# Patient Record
Sex: Female | Born: 1950 | Race: White | Hispanic: No | Smoking: Former smoker
Health system: Southern US, Community
[De-identification: ages and names within clinical notes are randomized; demographics above are authoritative.]

## PROBLEM LIST (undated history)

## (undated) DIAGNOSIS — K259 Gastric ulcer, unspecified as acute or chronic, without hemorrhage or perforation: Secondary | ICD-10-CM

## (undated) DIAGNOSIS — D219 Benign neoplasm of connective and other soft tissue, unspecified: Secondary | ICD-10-CM

## (undated) DIAGNOSIS — K219 Gastro-esophageal reflux disease without esophagitis: Secondary | ICD-10-CM

## (undated) DIAGNOSIS — J8410 Pulmonary fibrosis, unspecified: Secondary | ICD-10-CM

## (undated) HISTORY — PX: LUNG BIOPSY: SHX232

## (undated) HISTORY — PX: APPENDECTOMY: SHX54

## (undated) HISTORY — DX: Gastric ulcer, unspecified as acute or chronic, without hemorrhage or perforation: K25.9

## (undated) HISTORY — DX: Pulmonary fibrosis, unspecified: J84.10

## (undated) HISTORY — DX: Gastro-esophageal reflux disease without esophagitis: K21.9

---

## 2011-04-28 ENCOUNTER — Ambulatory Visit: Payer: Self-pay | Admitting: Family Medicine

## 2011-07-24 ENCOUNTER — Ambulatory Visit: Payer: Self-pay | Admitting: Family Medicine

## 2012-03-02 ENCOUNTER — Ambulatory Visit: Payer: Self-pay | Admitting: Internal Medicine

## 2012-03-20 ENCOUNTER — Inpatient Hospital Stay: Payer: Self-pay | Admitting: Internal Medicine

## 2012-03-20 LAB — COMPREHENSIVE METABOLIC PANEL
Anion Gap: 8 (ref 7–16)
BUN: 21 mg/dL — ABNORMAL HIGH (ref 7–18)
Bilirubin,Total: 0.5 mg/dL (ref 0.2–1.0)
Calcium, Total: 8.9 mg/dL (ref 8.5–10.1)
Chloride: 105 mmol/L (ref 98–107)
EGFR (African American): >60
EGFR (Non-African Amer.): >60
Total Protein: 6.9 g/dL (ref 6.4–8.2)

## 2012-03-20 LAB — CBC
HGB: 10.6 g/dL — ABNORMAL LOW (ref 12.0–16.0)
MCV: 104 fL — ABNORMAL HIGH (ref 80–100)
Platelet: 266 10*3/uL (ref 150–440)
RBC: 3.29 10*6/uL — ABNORMAL LOW (ref 3.80–5.20)
WBC: 14.2 10*3/uL — ABNORMAL HIGH (ref 3.6–11.0)

## 2012-03-20 LAB — CK TOTAL AND CKMB (NOT AT ARMC)
CK, Total: 106 U/L (ref 21–215)
CK-MB: 5.9 ng/mL — ABNORMAL HIGH (ref 0.5–3.6)

## 2012-03-20 LAB — PRO B NATRIURETIC PEPTIDE: B-Type Natriuretic Peptide: 119 pg/mL (ref 0–125)

## 2012-03-20 LAB — TROPONIN I: Troponin-I: <0.02 ng/mL

## 2012-03-21 LAB — CBC WITH DIFFERENTIAL/PLATELET
Basophil #: 0 10*3/uL (ref 0.0–0.1)
Lymphocyte %: 6.1 %
MCV: 104 fL — ABNORMAL HIGH (ref 80–100)
Monocyte #: 0.7 x10 3/mm (ref 0.2–0.9)
Monocyte %: 5.7 %
Neutrophil #: 10.6 10*3/uL — ABNORMAL HIGH (ref 1.4–6.5)
Neutrophil %: 88.2 %
Platelet: 186 10*3/uL (ref 150–440)
WBC: 12 10*3/uL — ABNORMAL HIGH (ref 3.6–11.0)

## 2012-03-21 LAB — COMPREHENSIVE METABOLIC PANEL
Albumin: 2.7 g/dL — ABNORMAL LOW (ref 3.4–5.0)
Alkaline Phosphatase: 55 U/L (ref 50–136)
Anion Gap: 6 — ABNORMAL LOW (ref 7–16)
BUN: 17 mg/dL (ref 7–18)
Calcium, Total: 8.4 mg/dL — ABNORMAL LOW (ref 8.5–10.1)
Creatinine: 0.85 mg/dL (ref 0.60–1.30)
EGFR (African American): >60
EGFR (Non-African Amer.): >60
Osmolality: 286 (ref 275–301)
Potassium: 4.4 mmol/L (ref 3.5–5.1)
SGOT(AST): 27 U/L (ref 15–37)
SGPT (ALT): 25 U/L

## 2012-03-22 LAB — CREATININE, SERUM: Creatinine: 0.99 mg/dL (ref 0.60–1.30)

## 2012-03-22 LAB — VANCOMYCIN, TROUGH: Vancomycin, Trough: 6 ug/mL — ABNORMAL LOW (ref 10–20)

## 2012-03-23 LAB — CREATININE, SERUM: EGFR (African American): >60

## 2012-03-24 LAB — VANCOMYCIN, TROUGH: Vancomycin, Trough: 11 ug/mL (ref 10–20)

## 2012-03-26 LAB — BASIC METABOLIC PANEL
Anion Gap: 10 (ref 7–16)
BUN: 32 mg/dL — ABNORMAL HIGH (ref 7–18)
Co2: 29 mmol/L (ref 21–32)
EGFR (African American): 55 — ABNORMAL LOW
EGFR (Non-African Amer.): 47 — ABNORMAL LOW
Osmolality: 281 (ref 275–301)
Potassium: 4.8 mmol/L (ref 3.5–5.1)
Sodium: 136 mmol/L (ref 136–145)

## 2012-03-26 LAB — HEMOGLOBIN
HGB: 8.7 g/dL — ABNORMAL LOW (ref 12.0–16.0)
HGB: 8.9 g/dL — ABNORMAL LOW (ref 12.0–16.0)

## 2012-03-26 LAB — CULTURE, BLOOD (SINGLE)

## 2012-04-01 ENCOUNTER — Ambulatory Visit: Payer: Self-pay | Admitting: Internal Medicine

## 2013-08-25 ENCOUNTER — Emergency Department: Payer: Self-pay | Admitting: Emergency Medicine

## 2013-08-26 ENCOUNTER — Other Ambulatory Visit: Payer: Self-pay

## 2013-08-26 ENCOUNTER — Encounter (HOSPITAL_COMMUNITY): Payer: Self-pay | Admitting: Emergency Medicine

## 2013-08-26 ENCOUNTER — Encounter (HOSPITAL_COMMUNITY): Payer: MEDICAID | Admitting: Anesthesiology

## 2013-08-26 ENCOUNTER — Inpatient Hospital Stay (HOSPITAL_COMMUNITY): Payer: Self-pay | Admitting: Anesthesiology

## 2013-08-26 ENCOUNTER — Inpatient Hospital Stay (HOSPITAL_COMMUNITY): Payer: Self-pay

## 2013-08-26 ENCOUNTER — Emergency Department (HOSPITAL_COMMUNITY): Payer: Self-pay

## 2013-08-26 ENCOUNTER — Encounter (HOSPITAL_COMMUNITY)
Admission: EM | Disposition: A | Discharge status: Hospice | Payer: Self-pay | Source: Ambulatory Visit | Attending: Pulmonary Disease

## 2013-08-26 ENCOUNTER — Inpatient Hospital Stay (HOSPITAL_COMMUNITY)
Admission: EM | Admit: 2013-08-26 | Discharge: 2013-09-08 | DRG: 326 | Disposition: A | Discharge status: Hospice | Payer: Self-pay | Source: Ambulatory Visit | Attending: Pulmonary Disease | Admitting: Pulmonary Disease

## 2013-08-26 DIAGNOSIS — R198 Other specified symptoms and signs involving the digestive system and abdomen: Secondary | ICD-10-CM

## 2013-08-26 DIAGNOSIS — E875 Hyperkalemia: Secondary | ICD-10-CM | POA: Diagnosis present

## 2013-08-26 DIAGNOSIS — E2740 Unspecified adrenocortical insufficiency: Secondary | ICD-10-CM

## 2013-08-26 DIAGNOSIS — IMO0002 Reserved for concepts with insufficient information to code with codable children: Secondary | ICD-10-CM

## 2013-08-26 DIAGNOSIS — Z515 Encounter for palliative care: Secondary | ICD-10-CM

## 2013-08-26 DIAGNOSIS — E2749 Other adrenocortical insufficiency: Secondary | ICD-10-CM | POA: Diagnosis present

## 2013-08-26 DIAGNOSIS — R1310 Dysphagia, unspecified: Secondary | ICD-10-CM | POA: Diagnosis present

## 2013-08-26 DIAGNOSIS — J4489 Other specified chronic obstructive pulmonary disease: Secondary | ICD-10-CM | POA: Diagnosis present

## 2013-08-26 DIAGNOSIS — K560 Paralytic ileus: Secondary | ICD-10-CM | POA: Diagnosis not present

## 2013-08-26 DIAGNOSIS — D696 Thrombocytopenia, unspecified: Secondary | ICD-10-CM | POA: Diagnosis present

## 2013-08-26 DIAGNOSIS — J9620 Acute and chronic respiratory failure, unspecified whether with hypoxia or hypercapnia: Secondary | ICD-10-CM | POA: Diagnosis present

## 2013-08-26 DIAGNOSIS — Y838 Other surgical procedures as the cause of abnormal reaction of the patient, or of later complication, without mention of misadventure at the time of the procedure: Secondary | ICD-10-CM | POA: Diagnosis not present

## 2013-08-26 DIAGNOSIS — E872 Acidosis, unspecified: Secondary | ICD-10-CM | POA: Diagnosis not present

## 2013-08-26 DIAGNOSIS — Z881 Allergy status to other antibiotic agents status: Secondary | ICD-10-CM

## 2013-08-26 DIAGNOSIS — E669 Obesity, unspecified: Secondary | ICD-10-CM | POA: Diagnosis present

## 2013-08-26 DIAGNOSIS — E878 Other disorders of electrolyte and fluid balance, not elsewhere classified: Secondary | ICD-10-CM | POA: Diagnosis present

## 2013-08-26 DIAGNOSIS — E876 Hypokalemia: Secondary | ICD-10-CM | POA: Diagnosis not present

## 2013-08-26 DIAGNOSIS — D649 Anemia, unspecified: Secondary | ICD-10-CM | POA: Diagnosis present

## 2013-08-26 DIAGNOSIS — R0902 Hypoxemia: Secondary | ICD-10-CM

## 2013-08-26 DIAGNOSIS — E871 Hypo-osmolality and hyponatremia: Secondary | ICD-10-CM | POA: Diagnosis present

## 2013-08-26 DIAGNOSIS — D72829 Elevated white blood cell count, unspecified: Secondary | ICD-10-CM | POA: Diagnosis present

## 2013-08-26 DIAGNOSIS — Z9981 Dependence on supplemental oxygen: Secondary | ICD-10-CM

## 2013-08-26 DIAGNOSIS — I498 Other specified cardiac arrhythmias: Secondary | ICD-10-CM | POA: Diagnosis present

## 2013-08-26 DIAGNOSIS — J449 Chronic obstructive pulmonary disease, unspecified: Secondary | ICD-10-CM | POA: Diagnosis present

## 2013-08-26 DIAGNOSIS — Z79899 Other long term (current) drug therapy: Secondary | ICD-10-CM

## 2013-08-26 DIAGNOSIS — K659 Peritonitis, unspecified: Secondary | ICD-10-CM | POA: Diagnosis present

## 2013-08-26 DIAGNOSIS — Z66 Do not resuscitate: Secondary | ICD-10-CM | POA: Diagnosis present

## 2013-08-26 DIAGNOSIS — E8779 Other fluid overload: Secondary | ICD-10-CM | POA: Diagnosis present

## 2013-08-26 DIAGNOSIS — D219 Benign neoplasm of connective and other soft tissue, unspecified: Secondary | ICD-10-CM

## 2013-08-26 DIAGNOSIS — Z01811 Encounter for preprocedural respiratory examination: Secondary | ICD-10-CM

## 2013-08-26 DIAGNOSIS — K929 Disease of digestive system, unspecified: Secondary | ICD-10-CM | POA: Diagnosis not present

## 2013-08-26 DIAGNOSIS — K255 Chronic or unspecified gastric ulcer with perforation: Principal | ICD-10-CM | POA: Diagnosis present

## 2013-08-26 DIAGNOSIS — R7989 Other specified abnormal findings of blood chemistry: Secondary | ICD-10-CM | POA: Diagnosis present

## 2013-08-26 DIAGNOSIS — J8410 Pulmonary fibrosis, unspecified: Secondary | ICD-10-CM | POA: Diagnosis present

## 2013-08-26 DIAGNOSIS — K251 Acute gastric ulcer with perforation: Secondary | ICD-10-CM

## 2013-08-26 HISTORY — DX: Benign neoplasm of connective and other soft tissue, unspecified: D21.9

## 2013-08-26 HISTORY — PX: LAPAROTOMY: SHX154

## 2013-08-26 LAB — COMPREHENSIVE METABOLIC PANEL
ALT: 24 U/L (ref 0–35)
Albumin: 3.5 g/dL (ref 3.4–5.0)
Alkaline Phosphatase: 71 U/L (ref 39–117)
BUN: 20 mg/dL — ABNORMAL HIGH (ref 7–18)
BUN: 23 mg/dL (ref 6–23)
Calcium: 8.8 mg/dL (ref 8.4–10.5)
Chloride: 100 mmol/L (ref 98–107)
Chloride: 94 mEq/L — ABNORMAL LOW (ref 96–112)
Co2: 27 mmol/L (ref 21–32)
Creatinine, Ser: 1.01 mg/dL (ref 0.50–1.10)
Creatinine: 0.81 mg/dL (ref 0.60–1.30)
GFR calc Af Amer: 68 mL/min — ABNORMAL LOW (ref >90)
GFR calc non Af Amer: 58 mL/min — ABNORMAL LOW (ref >90)
Glucose, Bld: 103 mg/dL — ABNORMAL HIGH (ref 70–99)
Glucose: 152 mg/dL — ABNORMAL HIGH (ref 65–99)
Potassium: 4.1 mmol/L (ref 3.5–5.1)
SGPT (ALT): 31 U/L (ref 12–78)
Sodium: 129 mEq/L — ABNORMAL LOW (ref 135–145)
Total Bilirubin: 1.2 mg/dL (ref 0.3–1.2)
Total Protein: 5.7 g/dL — ABNORMAL LOW (ref 6.0–8.3)
Total Protein: 5.8 g/dL — ABNORMAL LOW (ref 6.4–8.2)

## 2013-08-26 LAB — CBC WITH DIFFERENTIAL/PLATELET
Basophils Relative: 0 % (ref 0–1)
Eosinophil %: 0 %
Eosinophils Absolute: 0 10*3/uL (ref 0.0–0.7)
Eosinophils Relative: 0 % (ref 0–5)
HCT: 34.7 % — ABNORMAL LOW (ref 35.0–47.0)
HCT: 35.4 % — ABNORMAL LOW (ref 36.0–46.0)
Hemoglobin: 10.8 g/dL — ABNORMAL LOW (ref 12.0–15.0)
Lymphocyte #: 0.5 10*3/uL — ABNORMAL LOW (ref 1.0–3.6)
Lymphocyte %: 2.5 %
Lymphocytes Relative: 3 % — ABNORMAL LOW (ref 12–46)
Lymphs Abs: 0.6 10*3/uL — ABNORMAL LOW (ref 0.7–4.0)
MCH: 30.4 pg (ref 26.0–34.0)
MCH: 30.4 pg (ref 26.0–34.0)
MCHC: 30.5 g/dL (ref 30.0–36.0)
MCHC: 31.4 g/dL — ABNORMAL LOW (ref 32.0–36.0)
Monocyte %: 2 %
Monocytes Absolute: 0.2 10*3/uL (ref 0.1–1.0)
Neutro Abs: 18 10*3/uL — ABNORMAL HIGH (ref 1.7–7.7)
Neutrophil %: 95.3 %
Platelets: 178 10*3/uL (ref 150–400)
RBC: 3.59 10*6/uL — ABNORMAL LOW (ref 3.80–5.20)
RDW: 21.6 % — ABNORMAL HIGH (ref 11.5–14.5)
WBC: 21.4 10*3/uL — ABNORMAL HIGH (ref 3.6–11.0)

## 2013-08-26 LAB — BLOOD GAS, ARTERIAL
Acid-base deficit: 0.1 mmol/L (ref 0.0–2.0)
Bicarbonate: 25.6 mEq/L — ABNORMAL HIGH (ref 20.0–24.0)
FIO2: 1 %
O2 Saturation: 97.9 %
Patient temperature: 98.6
RATE: 14 resp/min
pCO2 arterial: 50.7 mmHg — ABNORMAL HIGH (ref 35.0–45.0)
pO2, Arterial: 452 mmHg — ABNORMAL HIGH (ref 80.0–100.0)

## 2013-08-26 LAB — TYPE AND SCREEN: Antibody Screen: NEGATIVE

## 2013-08-26 LAB — LIPASE, BLOOD
Lipase: 113 U/L (ref 73–393)
Lipase: 46 U/L (ref 11–59)

## 2013-08-26 LAB — GLUCOSE, CAPILLARY: Glucose-Capillary: 91 mg/dL (ref 70–99)

## 2013-08-26 LAB — ABO/RH: ABO/RH(D): A POS

## 2013-08-26 SURGERY — LAPAROTOMY, EXPLORATORY
Anesthesia: General | Site: Abdomen | Wound class: Dirty or Infected

## 2013-08-26 MED ORDER — MIDAZOLAM HCL 2 MG/2ML IJ SOLN
INTRAMUSCULAR | Status: AC
  Filled 2013-08-26: qty 2

## 2013-08-26 MED ORDER — PANTOPRAZOLE SODIUM 40 MG IV SOLR
40.0000 mg | Freq: Two times a day (BID) | INTRAVENOUS | Status: DC
  Administered 2013-08-30 – 2013-09-03 (×10): 40 mg via INTRAVENOUS
  Filled 2013-08-26 (×12): qty 40

## 2013-08-26 MED ORDER — ENOXAPARIN SODIUM 40 MG/0.4ML ~~LOC~~ SOLN
40.0000 mg | SUBCUTANEOUS | Status: DC
  Administered 2013-08-27 – 2013-09-08 (×12): 40 mg via SUBCUTANEOUS
  Filled 2013-08-26 (×14): qty 0.4

## 2013-08-26 MED ORDER — SUCCINYLCHOLINE CHLORIDE 20 MG/ML IJ SOLN
INTRAMUSCULAR | Status: AC
  Filled 2013-08-26: qty 1

## 2013-08-26 MED ORDER — POTASSIUM CHLORIDE IN NACL 20-0.9 MEQ/L-% IV SOLN
INTRAVENOUS | Status: DC
  Administered 2013-08-26 (×2): via INTRAVENOUS
  Filled 2013-08-26 (×4): qty 1000

## 2013-08-26 MED ORDER — ALBUTEROL SULFATE (5 MG/ML) 0.5% IN NEBU: 2.5000 mg | INHALATION_SOLUTION | RESPIRATORY_TRACT | Status: DC | PRN

## 2013-08-26 MED ORDER — BIOTENE DRY MOUTH MT LIQD
15.0000 mL | Freq: Four times a day (QID) | OROMUCOSAL | Status: DC
  Administered 2013-08-26 – 2013-08-28 (×8): 15 mL via OROMUCOSAL

## 2013-08-26 MED ORDER — MIDAZOLAM HCL 2 MG/2ML IJ SOLN
1.0000 mg | INTRAMUSCULAR | Status: DC | PRN
  Administered 2013-08-27 (×2): 2 mg via INTRAVENOUS
  Filled 2013-08-26 (×2): qty 2

## 2013-08-26 MED ORDER — SODIUM CHLORIDE 0.9 % IV SOLN
80.0000 mg | Freq: Once | INTRAVENOUS | Status: AC
  Administered 2013-08-26: 19:00:00 80 mg via INTRAVENOUS
  Filled 2013-08-26: qty 80

## 2013-08-26 MED ORDER — ONDANSETRON HCL 4 MG/2ML IJ SOLN: 4.0000 mg | Freq: Four times a day (QID) | INTRAMUSCULAR | Status: DC | PRN

## 2013-08-26 MED ORDER — SODIUM CHLORIDE 0.9 % IR SOLN
Status: DC | PRN
  Administered 2013-08-26: 5000 mL

## 2013-08-26 MED ORDER — FENTANYL CITRATE 0.05 MG/ML IJ SOLN
INTRAMUSCULAR | Status: AC
  Filled 2013-08-26: qty 5

## 2013-08-26 MED ORDER — FENTANYL CITRATE 0.05 MG/ML IJ SOLN
INTRAMUSCULAR | Status: AC
  Administered 2013-08-26: 100 ug
  Filled 2013-08-26: qty 2

## 2013-08-26 MED ORDER — CHLORHEXIDINE GLUCONATE 0.12 % MT SOLN
15.0000 mL | Freq: Two times a day (BID) | OROMUCOSAL | Status: DC
  Administered 2013-08-26 – 2013-08-29 (×6): 15 mL via OROMUCOSAL
  Filled 2013-08-26 (×7): qty 15

## 2013-08-26 MED ORDER — ALBUTEROL SULFATE (5 MG/ML) 0.5% IN NEBU: 2.5000 mg | INHALATION_SOLUTION | Freq: Four times a day (QID) | RESPIRATORY_TRACT | Status: DC

## 2013-08-26 MED ORDER — PIPERACILLIN-TAZOBACTAM 3.375 G IVPB
3.3750 g | Freq: Three times a day (TID) | INTRAVENOUS | Status: DC
  Administered 2013-08-26 – 2013-08-29 (×9): 3.375 g via INTRAVENOUS
  Filled 2013-08-26 (×10): qty 50

## 2013-08-26 MED ORDER — SODIUM CHLORIDE 0.9 % IV SOLN
INTRAVENOUS | Status: DC
  Administered 2013-08-26: 06:00:00 via INTRAVENOUS

## 2013-08-26 MED ORDER — PIPERACILLIN-TAZOBACTAM 3.375 G IVPB
INTRAVENOUS | Status: AC
  Filled 2013-08-26: qty 50

## 2013-08-26 MED ORDER — ONDANSETRON HCL 4 MG/2ML IJ SOLN
INTRAMUSCULAR | Status: DC | PRN
  Administered 2013-08-26: 4 mg via INTRAVENOUS

## 2013-08-26 MED ORDER — LIDOCAINE HCL (CARDIAC) 20 MG/ML IV SOLN
INTRAVENOUS | Status: DC | PRN
  Administered 2013-08-26: 60 mg via INTRAVENOUS

## 2013-08-26 MED ORDER — LACTATED RINGERS IV SOLN
INTRAVENOUS | Status: DC | PRN
  Administered 2013-08-26: 08:00:00 via INTRAVENOUS

## 2013-08-26 MED ORDER — PIPERACILLIN-TAZOBACTAM 3.375 G IVPB 30 MIN
3.3750 g | Freq: Once | INTRAVENOUS | Status: AC
  Administered 2013-08-26: 3.375 g via INTRAVENOUS

## 2013-08-26 MED ORDER — LACTATED RINGERS IV SOLN
INTRAVENOUS | Status: DC
  Administered 2013-08-26: 1000 mL via INTRAVENOUS

## 2013-08-26 MED ORDER — SODIUM CHLORIDE 0.9 % IV SOLN
0.0000 ug/h | INTRAVENOUS | Status: DC
  Administered 2013-08-26: 125 ug/h via INTRAVENOUS
  Administered 2013-08-27 (×2): 50 ug/h via INTRAVENOUS
  Administered 2013-08-28: 60 ug/h via INTRAVENOUS
  Administered 2013-08-30: 75 ug/h via INTRAVENOUS
  Filled 2013-08-26 (×5): qty 50

## 2013-08-26 MED ORDER — SODIUM CHLORIDE 0.9 % IV SOLN: 250.0000 mL | INTRAVENOUS | Status: DC | PRN

## 2013-08-26 MED ORDER — ROCURONIUM BROMIDE 100 MG/10ML IV SOLN
INTRAVENOUS | Status: DC | PRN
  Administered 2013-08-26: 50 mg via INTRAVENOUS
  Administered 2013-08-26: 30 mg via INTRAVENOUS

## 2013-08-26 MED ORDER — HYDROCORTISONE SOD SUCCINATE 100 MG IJ SOLR
50.0000 mg | Freq: Four times a day (QID) | INTRAMUSCULAR | Status: DC
  Administered 2013-08-26 – 2013-08-28 (×9): 50 mg via INTRAVENOUS
  Filled 2013-08-26: qty 1
  Filled 2013-08-26 (×5): qty 2
  Filled 2013-08-26: qty 1
  Filled 2013-08-26: qty 2
  Filled 2013-08-26 (×6): qty 1

## 2013-08-26 MED ORDER — ROCURONIUM BROMIDE 100 MG/10ML IV SOLN
INTRAVENOUS | Status: AC
  Filled 2013-08-26: qty 1

## 2013-08-26 MED ORDER — FENTANYL CITRATE 0.05 MG/ML IJ SOLN: 100.0000 ug | Freq: Once | INTRAMUSCULAR | Status: AC

## 2013-08-26 MED ORDER — PROPOFOL 10 MG/ML IV BOLUS
INTRAVENOUS | Status: AC
  Filled 2013-08-26: qty 20

## 2013-08-26 MED ORDER — ONDANSETRON HCL 4 MG PO TABS: 4.0000 mg | ORAL_TABLET | Freq: Four times a day (QID) | ORAL | Status: DC | PRN

## 2013-08-26 MED ORDER — LIDOCAINE HCL (CARDIAC) 20 MG/ML IV SOLN
INTRAVENOUS | Status: AC
  Filled 2013-08-26: qty 5

## 2013-08-26 MED ORDER — SUCCINYLCHOLINE CHLORIDE 20 MG/ML IJ SOLN
INTRAMUSCULAR | Status: DC | PRN
  Administered 2013-08-26: 100 mg via INTRAVENOUS

## 2013-08-26 MED ORDER — FLUCONAZOLE IN SODIUM CHLORIDE 400-0.9 MG/200ML-% IV SOLN
400.0000 mg | Freq: Every day | INTRAVENOUS | Status: DC
  Administered 2013-08-26 – 2013-09-01 (×7): 400 mg via INTRAVENOUS
  Filled 2013-08-26 (×8): qty 200

## 2013-08-26 MED ORDER — PHENYLEPHRINE HCL 10 MG/ML IJ SOLN
20.0000 mg | INTRAVENOUS | Status: DC | PRN
  Administered 2013-08-26: 20 ug/min via INTRAVENOUS

## 2013-08-26 MED ORDER — SODIUM CHLORIDE 0.9 % IV SOLN
8.0000 mg/h | INTRAVENOUS | Status: DC
  Administered 2013-08-26 – 2013-08-28 (×4): 8 mg/h via INTRAVENOUS
  Filled 2013-08-26 (×8): qty 80

## 2013-08-26 MED ORDER — ONDANSETRON HCL 4 MG/2ML IJ SOLN
INTRAMUSCULAR | Status: AC
  Filled 2013-08-26: qty 2

## 2013-08-26 MED ORDER — PHENYLEPHRINE 40 MCG/ML (10ML) SYRINGE FOR IV PUSH (FOR BLOOD PRESSURE SUPPORT)
PREFILLED_SYRINGE | INTRAVENOUS | Status: AC
  Filled 2013-08-26: qty 10

## 2013-08-26 MED ORDER — PHENYLEPHRINE HCL 10 MG/ML IJ SOLN
INTRAMUSCULAR | Status: AC
  Filled 2013-08-26: qty 2

## 2013-08-26 MED ORDER — FENTANYL BOLUS VIA INFUSION
50.0000 ug | INTRAVENOUS | Status: DC | PRN
  Administered 2013-08-29: 100 ug via INTRAVENOUS
  Administered 2013-08-29 (×2): 50 ug via INTRAVENOUS
  Filled 2013-08-26: qty 100

## 2013-08-26 MED ORDER — PROPOFOL 10 MG/ML IV BOLUS
INTRAVENOUS | Status: DC | PRN
  Administered 2013-08-26: 100 mg via INTRAVENOUS

## 2013-08-26 MED ORDER — HYDROCORTISONE SOD SUCCINATE 100 MG IJ SOLR
INTRAMUSCULAR | Status: AC
  Filled 2013-08-26: qty 2

## 2013-08-26 MED ORDER — INSULIN ASPART 100 UNIT/ML ~~LOC~~ SOLN
0.0000 [IU] | SUBCUTANEOUS | Status: DC
  Administered 2013-08-26 – 2013-09-03 (×4): 2 [IU] via SUBCUTANEOUS

## 2013-08-26 MED ORDER — PANTOPRAZOLE SODIUM 40 MG IV SOLR
40.0000 mg | Freq: Two times a day (BID) | INTRAVENOUS | Status: DC
  Administered 2013-08-26: 40 mg via INTRAVENOUS
  Filled 2013-08-26: qty 40

## 2013-08-26 MED ORDER — MIDAZOLAM HCL 5 MG/5ML IJ SOLN
INTRAMUSCULAR | Status: DC | PRN
  Administered 2013-08-26: 2 mg via INTRAVENOUS

## 2013-08-26 MED ORDER — FENTANYL CITRATE 0.05 MG/ML IJ SOLN
50.0000 ug | Freq: Once | INTRAMUSCULAR | Status: AC
Start: 2013-08-26 — End: 2013-08-26

## 2013-08-26 MED ORDER — FENTANYL CITRATE 0.05 MG/ML IJ SOLN
INTRAMUSCULAR | Status: DC | PRN
  Administered 2013-08-26 (×3): 50 ug via INTRAVENOUS

## 2013-08-26 MED ORDER — HYDROCORTISONE SOD SUCCINATE 100 MG IJ SOLR
INTRAMUSCULAR | Status: DC | PRN
  Administered 2013-08-26: 100 mg via INTRAVENOUS

## 2013-08-26 MED ORDER — ALBUTEROL SULFATE (5 MG/ML) 0.5% IN NEBU
2.5000 mg | INHALATION_SOLUTION | Freq: Four times a day (QID) | RESPIRATORY_TRACT | Status: DC
  Administered 2013-08-26 – 2013-09-02 (×28): 2.5 mg via RESPIRATORY_TRACT
  Filled 2013-08-26 (×28): qty 0.5

## 2013-08-26 MED ORDER — PHENYLEPHRINE HCL 10 MG/ML IJ SOLN
INTRAMUSCULAR | Status: DC | PRN
  Administered 2013-08-26 (×3): 80 ug via INTRAVENOUS

## 2013-08-26 SURGICAL SUPPLY — 37 items
APPLICATOR COTTON TIP 6IN STRL (MISCELLANEOUS) ×2 IMPLANT
BLADE EXTENDED COATED 6.5IN (ELECTRODE) IMPLANT
BLADE HEX COATED 2.75 (ELECTRODE) ×2 IMPLANT
CANISTER SUCTION 2500CC (MISCELLANEOUS) ×2 IMPLANT
COVER MAYO STAND STRL (DRAPES) IMPLANT
DRAIN CHANNEL 19F RND (DRAIN) ×6 IMPLANT
DRAPE LAPAROSCOPIC ABDOMINAL (DRAPES) ×2 IMPLANT
DRAPE WARM FLUID 44X44 (DRAPE) IMPLANT
ELECT REM PT RETURN 9FT ADLT (ELECTROSURGICAL) ×2
ELECTRODE REM PT RTRN 9FT ADLT (ELECTROSURGICAL) ×1 IMPLANT
GLOVE BIOGEL PI IND STRL 7.0 (GLOVE) ×1 IMPLANT
GLOVE BIOGEL PI INDICATOR 7.0 (GLOVE) ×1
GLOVE EUDERMIC 7 POWDERFREE (GLOVE) ×2 IMPLANT
GOWN PREVENTION PLUS LG XLONG (DISPOSABLE) ×2 IMPLANT
GOWN STRL REIN XL XLG (GOWN DISPOSABLE) ×4 IMPLANT
KIT BASIN OR (CUSTOM PROCEDURE TRAY) ×2 IMPLANT
NS IRRIG 1000ML POUR BTL (IV SOLUTION) ×2 IMPLANT
PACK GENERAL/GYN (CUSTOM PROCEDURE TRAY) ×2 IMPLANT
SPONGE GAUZE 4X4 12PLY (GAUZE/BANDAGES/DRESSINGS) ×2 IMPLANT
SPONGE LAP 18X18 X RAY DECT (DISPOSABLE) IMPLANT
STAPLER VISISTAT 35W (STAPLE) ×2 IMPLANT
SUCTION POOLE TIP (SUCTIONS) IMPLANT
SUT PDS AB 1 CTX 36 (SUTURE) ×4 IMPLANT
SUT PDS AB 1 TP1 96 (SUTURE) ×4 IMPLANT
SUT POLYDEK 5 CE 75 36 (SUTURE) ×2 IMPLANT
SUT RET BRIDGE (SUTURE) ×2 IMPLANT
SUT SILK 2 0 (SUTURE) ×1
SUT SILK 2 0 SH CR/8 (SUTURE) IMPLANT
SUT SILK 2-0 18XBRD TIE 12 (SUTURE) ×1 IMPLANT
SUT SILK 3 0 (SUTURE)
SUT SILK 3 0 SH CR/8 (SUTURE) ×2 IMPLANT
SUT SILK 3-0 18XBRD TIE 12 (SUTURE) IMPLANT
SUT VICRYL 2 0 18  UND BR (SUTURE)
SUT VICRYL 2 0 18 UND BR (SUTURE) IMPLANT
TOWEL OR 17X26 10 PK STRL BLUE (TOWEL DISPOSABLE) ×4 IMPLANT
TRAY FOLEY CATH 14FRSI W/METER (CATHETERS) ×2 IMPLANT
YANKAUER SUCT BULB TIP NO VENT (SUCTIONS) IMPLANT

## 2013-08-26 NOTE — Transfer of Care (Signed)
Immediate Anesthesia Transfer of Care Note  Patient: Tanya Horne  Procedure(s) Performed: Procedure(s): EXPLORATORY LAPAROTOMY closure of grastic ulcer  Patient Location: PACU and ICU  Anesthesia Type:General  Level of Consciousness: Patient remains intubated per anesthesia plan  Airway & Oxygen Therapy: Patient remains intubated per anesthesia plan  Post-op Assessment: Report given to PACU RN and Post -op Vital signs reviewed and stable  Post vital signs: Reviewed and stable  Complications: No apparent anesthesia complications

## 2013-08-26 NOTE — Anesthesia Preprocedure Evaluation (Addendum)
Anesthesia Evaluation  Patient identified by MRN, date of birth, ID band Patient awake    Reviewed: Allergy & Precautions, H&P , NPO status , Patient's Chart, lab work & pertinent test results  Airway Mallampati: II TM Distance: >3 FB Neck ROM: full    Dental no notable dental hx. (+) Dental Advisory Given   Pulmonary COPD COPD inhaler,  Acute on chronic respiratory failure. End stage Pulmonary fibrosis DNR in hospice. 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  Renal/GU negative Renal ROS  negative genitourinary   Musculoskeletal   Abdominal   Peds  Hematology negative hematology ROS (+)   Anesthesia Other Findings   Reproductive/Obstetrics negative OB ROS                         Anesthesia Physical Anesthesia Plan  ASA: V and emergent  Anesthesia Plan: General   Post-op Pain Management:    Induction: Intravenous and Rapid sequence  Airway Management Planned: Oral ETT  Additional Equipment:   Intra-op Plan:   Post-operative Plan: Post-operative intubation/ventilation  Informed Consent: I have reviewed the patients History and Physical, chart, labs and discussed the procedure including the risks, benefits and alternatives for the proposed anesthesia with the patient or authorized representative who has indicated his/her understanding and acceptance.   Dental Advisory Given  Plan Discussed with: CRNA and Surgeon  Anesthesia Plan Comments:        Anesthesia Quick Evaluation

## 2013-08-26 NOTE — ED Notes (Signed)
Transferred from [facility] with c/o abdominal pain--- pt has had CT scan:  Findings showed moderate amount of intraperitoneal air.

## 2013-08-26 NOTE — Op Note (Signed)
Patient Name:           Tanya Horne   Date of Surgery:        08/26/2013  Pre op Diagnosis:      Perforated viscus  Post op Diagnosis:    Perforated gastric ulcer with localized peritonitis  Procedure:                 Exploratory laparotomy, closure perforated gastric ulcer, omental patch, abdominal wound closure with retention sutures  Surgeon:                     Angelia Mould. Derrell Lolling, M.D., FACS  Assistant:                      None  Operative Indications:   This is a 62 year old white female who was transferred from [facility] this morning with pneumoperitoneum. She carries a diagnosis of pulmonary fibrosis. She is on chronic high-dose steroids. She is on high flow oxygen. She has been on home hospice since July 2013. She has a prior history of hysterectomy, appendectomy.    She is a DO NOT RESUSCITATE, but is willing to proceed with emergency surgery if necessary. On examination she has tenderness in the upper abdomen but is alert and coherent and able to understand the diagnosis, implications of treatment, and implications  of nontreatment. CT scan shows extravasation of contrast along the greater curvature of the stomach, consistent with a gastric perforation. No other gross abnormalities were noted. We had long discussions with her about goals of  care. Her desire is to attempt to repair the ulcer and then to see if she can be weaned from the ventilator.. She understands this may not be possible. She is brought to the operating room emergently.  Operative Findings:       She had a small benign-appearing perforation along the greater curvature of the stomach just behind the omental attachment. The proximal stomach both anteriorly and posteriorly felt soft. The distal stomach, pylorus and duodenum felt soft. The colon and small bowel did not look inflamed. There was cloudy fluid in the upper abdomen consistent with a perforated ulcer. There is no odor. Cultures were taken.  Procedure in  Detail:          Following the induction of general endotracheal anesthesia the patient's abdomen was prepped and draped in sterile fashion. Surgical time out was performed. Upper midline incision was made. Fascia was incised in the midline. Abdominal cavity was entered. Self-retaining retractors were placed. Cultures were taken of the fluid around the greater curvature of the stomach. After complete exploration I took down the gastrocolic omentum and then found the small perforation on the greater curvature. This was closed with several interrupted sutures of 2-0 silk. I then irrigated out the subphrenic spaces, abdomen, and pelvis with about 5 L of saline. There was not any bleeding. I then took a piece of omentum and tacked it on top of the closure with multiple interrupted sutures of 2-0 silk to provide a buttress. A 19 [place] Blake drain was placed behind the stomach in the lesser sac and brought out in the right upper quadrant, sutured to the skin and connected to a suction bulb. Midline fascia was closed with a running suture of #1 PDS and 3 #5 Ethibond retention sutures tied over plastic bolsters. The skin was packed open. The patient tolerated the procedure without any hemodynamic compromise. EBL 30-40 cc. Counts correct. Consultations none.  Angelia Mould. Derrell Lolling, M.D., FACS General and Minimally Invasive Surgery Breast and Colorectal Surgery  08/26/2013 9:44 AM

## 2013-08-26 NOTE — Progress Notes (Signed)
ANTIBIOTIC CONSULT NOTE - INITIAL  Pharmacy Consult for Fluconazole Indication: intra-abdominal coverage  Allergies  Allergen Reactions  . Sulfa Antibiotics Hives    Patient Measurements:   Adjusted Body Weight:   Vital Signs: Temp: 98.1 F (36.7 C) (11/25 0525) Temp src: Oral (11/25 0416) BP: 131/73 mmHg (11/25 0700) Pulse Rate: 126 (11/25 0700) Intake/Output from previous day: 11/24 0701 - 11/25 0700 In: 1000 [I.V.:1000] Out: -  Intake/Output from this shift:    Labs:  Recent Labs  08/26/13 0505  WBC 18.8*  HGB 10.8*  PLT 178  CREATININE 1.01   CrCl is unknown because there is no height on file for the current visit. No results found for this basename: VANCOTROUGH, VANCOPEAK, VANCORANDOM, GENTTROUGH, GENTPEAK, GENTRANDOM, TOBRATROUGH, TOBRAPEAK, TOBRARND, AMIKACINPEAK, AMIKACINTROU, AMIKACIN,  in the last 72 hours   Microbiology: No results found for this or any previous visit (from the past 720 hour(s)).  Medical History: Past Medical History  Diagnosis Date  . Fibroid     Assessment: 67 yoM transferred from [facility] with c/o abd pain. CT showed pnuemoperitoneum with extravasation along the greater curvature of the stomach.  Surgery and CCM on board. Pt has been on Hospice since July 2013 but wishes to undergo surgery. MD started Zosyn and ordered Fluconazole per pharmacy dosing.   11/25 >> Zosyn (MD) >> 11/25 >> Fluconazole >>  Tmax: afeb WBCs: 18.8K Renal: Scr 1.01, N 66  No cultures yet   Plan:   Fluconazole 400 mg IV q24h  Pharmacy will f/u  Geoffry Paradise, PharmD, BCPS Pager: [phone] 7:15 AM Pharmacy #: [id number]

## 2013-08-26 NOTE — Progress Notes (Signed)
General surgery attending:  I have interviewed and examined this patient. I have reviewed her CT scan. I have discussed her treatment plan with Dr. Ovidio Kin and Dr. Wandra Scot.  The patient clearly would like to make an effort to repair the perforation of her stomach to see if she can survive. She knows that this may or may not be possible. She agrees to risks and that we will rescind the DO NOT RESUSCITATE order in the operating room per our Policy. She requested the DO NOT RESUSCITATE order be reinstated postop. Dr. Sung Amabile states she will be ventillated for a few days to see if she can be weaned. She knows that she may or may not be weanable. She is very aware and understanding that she may not survive this event.  I dscussed the indications, details, techniques, and numerous risk of the surgery with the patient and her grandson. She clearly agrees to this plan and has clear understanding of its implications.At this time all of her questions are answered. She agrees with this plan   Angelia Mould. Derrell Lolling, M.D., [facility] General and Minimally invasive Surgery Breast and Colorectal Surgery Office:   [phone] Pager:   [phone]

## 2013-08-26 NOTE — ED Notes (Signed)
Dr. Claud Kelp, surgeon, at bedside.

## 2013-08-26 NOTE — H&P (Addendum)
Re:   Tanya Horne DOB:   05-31-1951 MRN:   [id number]  ASSESSMENT AND PLAN: 1.  Pneumoperitoneum with extravasation along the greater curvature on CT of abdomen at [facility].  Discussed with Dr. Marchelle Gearing.  To get CCM consult before any surgery, in that I'm not sure she could survive surgery.  Dr. Marchelle Gearing has spokenAnd more realistically, she will probably be very difficult to extubate.  Dr. Derrell Lolling is the hospital surgeon for [facility] and I will discuss plan and options with him.  I have spoken extensively to the patient and her son about the limits of surgery and the risks of complications (the greatest being not healing any operation and prolong intubation).  2.  History of pulmonary fibrosis  On 10 L nasal O2 and chronic steroids 3.  On chronic steroids 4.  On Hospice since July 2013  Currently a DNR.  Chief Complaint  Patient presents with  . Abdominal Pain   REFERRING PHYSICIAN: No primary provider on file.  HISTORY OF PRESENT ILLNESS: Tanya Horne is a 62 y.o. (DOB: 1951/09/15)  white  female whose primary care physician is No primary provider on file. and was transferred to [facility] from [place] with pneumoperitoneum.  She has carried the diagnosis of pulmoary fibrosis for about 3 years.  She has been on Hospice since July 2013.  She is followed by Dr. Baldo Ash in [facility] for her pulmonary disease.  She is on chronic steroids, currently 30 mg Prednisone QD and on 10 L O2 at home.  She is a DNR, but willing to rescind this for emergency surgery.  She has had pain on and off and takes Aleve about once a week for pain.  Then yesterday AM, about 6 AM, she developed increasing abdominal pain which got worse and she went to the [facility] ER.  They did a CT scan which showed pnuemoperitoneum with extravasation along the greater curvature of the stomach and transferred her to [facility].  She has no prior history of ulcer disease.  The patient's son, Tanya Horne, is in  the room with the patient.  He has the power of attorney.  WBC - 21,400 - 08/26/2013 ([place])   Past Medical History  Diagnosis Date  . Fibroid      History reviewed. No pertinent past surgical history.    No current facility-administered medications for this encounter.   Current Outpatient Prescriptions  Medication Sig Dispense Refill  . albuterol (PROVENTIL) (2.5 MG/3ML) 0.083% nebulizer solution Take 2.5 mg by nebulization every 6 (six) hours as needed for wheezing or shortness of breath.      . dapsone 100 MG tablet Take 100 mg by mouth every morning.      . furosemide (LASIX) 20 MG tablet Take 40 mg by mouth every morning.      . mycophenolate (CELLCEPT) 500 MG tablet Take 1,000 mg by mouth 2 (two) times daily.      . naproxen sodium (ANAPROX) 220 MG tablet Take 220 mg by mouth 2 (two) times daily as needed (pain).      . predniSONE (DELTASONE) 10 MG tablet Take 60 mg by mouth daily with breakfast.          Allergies  Allergen Reactions  . Sulfa Antibiotics Hives    REVIEW OF SYSTEMS: Skin:  No history of rash.  No history of abnormal moles. Infection:  No history of hepatitis or HIV.  No history of MRSA. Neurologic:  No history of stroke.  No history of seizure.  No history of headaches. Cardiac:  No history of hypertension. No history of heart disease.  No history of prior cardiac catheterization.  No history of seeing a cardiologist. Pulmonary:  End stage pulmonary fibrosis.  On home O2 at 10 L and chronic steroids.  Endocrine:  No diabetes. No thyroid disease. Gastrointestinal:  See HPI.   No history of liver disease.  No history of gall bladder disease.  No history of pancreas disease.  No history of colon disease.  She had an appendectomy at age 51. Urologic:  No history of kidney stones.  No history of bladder infections. Musculoskeletal:  No history of joint or back disease. Hematologic:  No bleeding disorder.  No history of anemia.  Not  anticoagulated. Psycho-social:  The patient is oriented.    SOCIAL and FAMILY HISTORY: Husband deceased. Her son, Tanya Horne [phone]), is at the bedside. Her daughter, Baruch Goldmann, is some where in the ER.  PHYSICAL EXAM: BP 111/84  Temp(Src) 98.3 F (36.8 C) (Oral)  Resp 39  SpO2 93%  General: Moderately obese WF who is alert.  She is tachypnic and does not look healthy. HEENT: Normal. Pupils equal. Neck: Supple. No mass.  No thyroid mass. Lymph Nodes:  No supraclavicular or cervical nodes. Lungs: Mild rhochi bilaterally and symmetric breath sounds. Heart:  RRR. No murmur or rub. Abdomen: Tender abdomen, but no rebound.  She has bowel sounds.  She has a well healed scar in her lower midline.  No hernia. Rectal: Not done. Extremities:  Modest strength and ROM  in upper and lower extremities. Neurologic:  Grossly intact to motor and sensory function. Psychiatric: Has normal mood and affect. Behavior is normal.   DATA REVIEWED: Notes from [place].  Ovidio Kin, MD,  [facility] [address] [place]  [address]   [place], [place]    [address] Phone:  [phone] FAX:  [phone]

## 2013-08-26 NOTE — ED Provider Notes (Signed)
MSE was initiated and I personally evaluated the patient and placed orders (if any) at  5:35 AM on August 26, 2013.  The patient appears stable so that the remainder of the MSE may be completed by another provider. Joslyn Devon accepted in transfer from [facility] by Dr. Ezzard Standing for emergency surgery.  Do, to a perforated stomach with moderate intraperitoneal air fluid levels Patient is alert, appropriate, moderately tender throughout the abdomen.   Arman Filter, NP 08/26/13 [phone]  I spoke with Dr. Ezzard Standing, who is aware of the patient.  He is requesting a pulmonary critical-care be contacted for assistance with medical management postoperatively.  As the patient has a history of pulmonary fibrosis I discussed her DO NOT RESUSCITATE status with her and her family, and they wished a full code  Arman Filter, NP 08/26/13 [phone]

## 2013-08-26 NOTE — Progress Notes (Signed)
eLink Physician-Brief Progress Note Patient Name: Talita Recht DOB: 1950/10/09 MRN: [id number]  Date of Service  08/26/2013   HPI/Events of Note     eICU Interventions  Reinstate DNR post code   Intervention Category Major Interventions: End of life / care limitation discussion  MCQUAID, DOUGLAS 08/26/2013, 7:19 PM

## 2013-08-26 NOTE — Progress Notes (Signed)
CARE MANAGEMENT NOTE 08/26/2013  Patient:  Tanya Horne, Tanya Horne   Account Number:  [id number]  Date Initiated:  08/26/2013  Documentation initiated by:  DAVIS,RHONDA  Subjective/Objective Assessment:   pt with end stage pulmonary fibrosis and now with gastric perforation requiring surgical intervention.  On vent for full resp support s/p surg.     Action/Plan:   patient is a patient of [facility]. RN is Renea Ee at [phone] or the main office at [phone]   Anticipated DC Date:  08/29/2013   Anticipated DC Plan:  HOME W HOSPICE CARE  In-house referral  Clinical Social Worker      DC Planning Services  CM consult      PAC Choice  HOSPICE   Choice offered to / List presented to:  NA   DME arranged  NA      DME agency  [facility]     HH arranged  NA      HH agency  [facility]   Status of service:  In process, will continue to follow Medicare Important Message given?  NA - LOS <3 / Initial given by admissions (If response is "NO", the following Medicare IM given date fields will be blank) Date Medicare IM given:   Date Additional Medicare IM given:    Discharge Disposition:    Per UR Regulation:  Reviewed for med. necessity/level of care/duration of stay  If discussed at Long Length of Stay Meetings, dates discussed:    Comments:  [id number]/Rhonda Earlene Plater RN, BSN, CCN: [id number] Case management. Chart reviewed for discharge planning and present needs. Discharge needs: none present at time of review.  PLEASE SEE ABOVE NOTE FOR HOSPICE CONTACTS. Next chart review due:  [id number]

## 2013-08-26 NOTE — Preoperative (Signed)
Beta Blockers   Reason not to administer Beta Blockers:Not Applicable 

## 2013-08-26 NOTE — H&P (Addendum)
PULMONARY  / CRITICAL CARE MEDICINE  Name: Tanya Horne MRN: [id number] DOB: [birth year]    ADMISSION DATE:  08/26/2013   REFERRING MD :  EDP PRIMARY SERVICE: PCCM  CHIEF COMPLAINT:   Intra-abdominal viscus perforation  BRIEF PATIENT DESCRIPTION:  Free intraperitoneal air on CT abdomen performed @ [facility] ED. End stage pulmonary fibrosis. Transferred to [facility] ED to consider surgical options  SIGNIFICANT EVENTS / STUDIES:  11/25 CT abd (@ [facility]): free intra-abdominal gas with apparent extravasation of enteral contrast from greater stomach curvature  LINES / TUBES:   CULTURES:   ANTIBIOTICS: Fluconazole 11/25 >>  Pip-tazo 11/25 >>   HISTORY OF PRESENT ILLNESS:   Admitted via ED with 24 hrs of abdominal pain and CT abdomen demonstrating free intra-peritoneal gas. She has hx of severe lung disease characterized as end stage pulm fibrosis, not otherwise specified despite surgical lung biopsy in March of 2013. She has been a Hospice patient for more than a year and has had a DNR order in place. At baseline, she wears 10 lpm Sublim[place] and ambulates a maximum of 15-20 feet. She resides at home with her son. Prior to onset of abdominal pain, she was in her usual state of poor health.  PAST MEDICAL HISTORY :  Pulmonary fibrosis as per HPI - managed @ [facility] Surgical lung biopsy 11/2012   Prior to Admission medications   Medication Sig Start Date End Date Taking? Authorizing Provider  albuterol (PROVENTIL) (2.5 MG/3ML) 0.083% nebulizer solution Take 2.5 mg by nebulization every 6 (six) hours as needed for wheezing or shortness of breath.   Yes Historical Provider, MD  dapsone 100 MG tablet Take 100 mg by mouth every morning.   Yes Historical Provider, MD  furosemide (LASIX) 20 MG tablet Take 40 mg by mouth every morning.   Yes Historical Provider, MD  mycophenolate (CELLCEPT) 500 MG tablet Take 1,000 mg by mouth 2 (two) times daily.   Yes Historical Provider, MD  naproxen sodium (ANAPROX) 220 MG  tablet Take 220 mg by mouth 2 (two) times daily as needed (pain).   Yes Historical Provider, MD  predniSONE (DELTASONE) 10 MG tablet Take 30 mg by mouth daily with breakfast.   Yes Historical Provider, MD   Allergies  Allergen Reactions  . Sulfa Antibiotics Hives    FAMILY HISTORY:  No family history on file. SOCIAL HISTORY:  reports that she has never smoked. She does not have any smokeless tobacco history on file. Her alcohol and drug histories are not on file.  REVIEW OF SYSTEMS:  As per HPI. Otherwise a detailed ROS is negative  SUBJECTIVE:   VITAL SIGNS: Temp:  [98.1 F (36.7 C)-98.3 F (36.8 C)] 98.1 F (36.7 C) (11/25 0525) Pulse Rate:  [119-126] 126 (11/25 0700) Resp:  [22-39] 32 (11/25 0700) BP: (107-131)/(69-84) 131/73 mmHg (11/25 0700) SpO2:  [90 %-96 %] 91 % (11/25 0700) HEMODYNAMICS:   VENTILATOR SETTINGS:   INTAKE / OUTPUT: Intake/Output     11/24 0701 - 11/25 0700 11/25 0701 - 11/26 0700   I.V. 1000    Total Intake 1000     Net +1000            PHYSICAL EXAMINATION: General:  Chronically ill appearing, mildly cushingoid facies, no overt distress Neuro:  No focal deficits, cognition intact HEENT: Cushingoid facies, otherwise WNL Cardiovascular: tachy, regular, no M  Lungs: diffuse bilateral crackles heard posteriorly, no wheezes Abdomen: obese, mildly distended, minimally firm, no BS heard, mildly diffusely tender Ext: warn,  no edema   LABS: I have reviewed all of today's lab results. Relevant abnormalities are discussed in the A/P section   CXR: Low volumes, gas under R hemidiaphragm, diffuse prominence of IS markings  ASSESSMENT / PLAN:   Principal Problem:   Intra-abdominal free air of unknown etiology Active Problems:   Acute-on-chronic respiratory failure   Pulmonary fibrosis   Pre-operative respiratory examination   Chronic use of steroids   Adrenal insufficiency   Severe hypoxemia   PULMONARY A: Chronic respiratory failure  with severe baseline hypoxemia Very high risk of peri-operative pulmonary problems Anticipated ventilator dependence and difficulty weaning post op P:   Supplemental oxygen to maintain SpO2 > 90% Post op vent mgmt Empiric nebulized albuterol  CARDIOVASCULAR A: Sinus tachycardia, reactive P:  Control underlying causes - pain, etc  RENAL A:  Mild hypnatremia P:   Monitor BMET intermittently Correct electrolytes as indicated   GASTROINTESTINAL A: Intra-abdominal free air Suspected gastric perforation Anticipated impaired healing post op (due to chronic steroids) P:   Discussed with [facility] (Dr Ezzard Standing and Dr Derrell Lolling) Planned laparotomy and repair noted SUP: IV PPI  HEMATOLOGIC A:  Mild anemia without overt blood loss P:  DVT px: SCDs Monitor CBC Transfuse for acute blood loss of hemodynamic significance or for Hgb < 7.0  INFECTIOUS A:  Leukocytosis Immunosuppressed (mycophenolate and steroids) Presumed peritonitis P:   Micro and abx as above Hold mycophenolate peri-operatively  ENDOCRINE A: Chronic steroid therapy Presumed adrenal insufficiency Risk of steroid induced hyperglycemia P:   Stress dose steroids CBGs/SSI ordered  NEUROLOGIC A: Pain P:   PRN analgesia Consider sedation post op for ventilator status  GOALS:  A detailed discussion was had with pt. She expressed wish to undergo surgery understanding the low likelihood of successful outcome with the following contingencies established:  1) DNR to be rescinded for OR per policy 2) DNR will be reinstated immediately post op 3) There is to be a relatively limited duration of vent support post operatively  We discussed a few days at most 4) Once we deem her resp status to be optimized post operatively, she is to be extubated with a plan for Do Not Intubate after that 5) If unable to optimize resp status sufficiently within a few days, full comfort care and terminal extubation is to be undertaken  I have  personally obtained a history, examined the patient, evaluated laboratory and imaging results, formulated the assessment and plan and placed orders. CRITICAL CARE: The patient is critically ill with multiple organ systems failure and requires high complexity decision making for assessment and support, frequent evaluation and titration of therapies, application of advanced monitoring technologies and extensive interpretation of multiple databases. Critical Care Time devoted to patient care services described in this note is 40 minutes.   Billy Fischer, MD ; [facility] [phone].  After 5:30 PM or weekends, call [phone]  Pulmonary and Critical Care Medicine [facility] Pager: [phone]  08/26/2013, 8:15 AM    Billy Fischer, MD ; [facility] service Mobile [phone].  After 5:30 PM or weekends, call [phone]

## 2013-08-26 NOTE — Progress Notes (Signed)
PULMONARY  / CRITICAL CARE MEDICINE  Name: Tanya Horne MRN: [id number] DOB: May 26, 1951    ADMISSION DATE:  08/26/2013   REFERRING MD :  EDP PRIMARY SERVICE: PCCM  CHIEF COMPLAINT:   Abdominal pain and found to have Intra-abdominal viscus perforation on CT scan  BRIEF PATIENT DESCRIPTION:  Free intraperitoneal air on CT abdomen performed @ [facility] ED. 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 Transferred to [facility] ED.  Patient was DNR and under Hospice Care since July but DNR than Hospice Care was reverse temporarily for emergent abdominal surgical intervention.  SIGNIFICANT EVENTS / STUDIES:  11/25: CT abd (@ [facility]): free intra-abdominal gas with apparent extravasation of enteral contrast from greater stomach curvature 11/25: LapEx to open abdomen and correction of greater curvature of gastrocolic area viscus perforation  Intubated for surgery support with predicted difficulty weaning given end stage IPF  LINES / TUBES: ET Tube 11/25 >>> Peripheral IVx2 11/25 >>> Foley 11/25 >>> Abdomen JP 11/25 >>> Gastric Tube 11/25 >>>  CULTURES: 11/25 Abdomen Fluid Culture >>> 11/25 Abdomen Fluid Anearobic Culture >>>  ANTIBIOTICS: Fluconazole 11/25 >>  Zosyn 11/25 >>   REVIEW OF SYSTEMS:  Patient was sedated post surgery and is now awake due to no sedation.  ROS no reviewable.  SUBJECTIVE: Per son, who is DPOA and will bring paper for documentation, patient was DNAR/Hospice but reversed for emergent surgical intervention.  He and the patient understand risk of not able to wean off of vent after surgery given current IPF disease states.  Son indicated that if vent cannot be wean off, will give patient a few days before withdrawal of support  VITAL SIGNS: Temp:  [98.1 F (36.7 C)-98.3 F (36.8 C)] 98.1 F (36.7 C) (11/25 0525) Pulse Rate:  [89-126] 89 (11/25 1100) Resp:  [14-39] 14 (11/25 1100) BP: (87-156)/(48-93) 87/48 mmHg (11/25 1100) SpO2:  [90 %-96  %] 94 % (11/25 1100) FiO2 (%):  [100 %] 100 % (11/25 1009) Weight:  [60.6 kg (133 lb 9.6 oz)] 60.6 kg (133 lb 9.6 oz) (11/25 1100) HEMODYNAMICS:   VENTILATOR SETTINGS: Vent Mode:  [-] PRVC FiO2 (%):  [100 %] 100 % Set Rate:  [14 bmp] 14 bmp Vt Set:  [450 mL] 450 mL PEEP:  [5 cmH20] 5 cmH20 Plateau Pressure:  [27 cmH20] 27 cmH20 INTAKE / OUTPUT: Intake/Output     11/24 0701 - 11/25 0700 11/25 0701 - 11/26 0700   I.V. (mL/kg) 1000 1600 (26.4)   Total Intake(mL/kg) 1000 1600 (26.4)   Urine (mL/kg/hr)  275 (0.9)   Blood  50 (0.2)   Total Output   325   Net +1000 +1275          PHYSICAL EXAMINATION: General:  Ill appearing women with mild agitation due to vent Neuro:  Vented, sedated but then awake and responsive HEENT: Cushingoid facies, otherwise WNL Cardiovascular: sinus tachycardia, no murmur/rub/rhonchi Lungs: coarse on vent machine, no wheeze Abdomen: surgical with bandage covering, no drainage on bandage, JP in place, bloody drainage Ext: warn, no edema    LABS: CBC    Component Value Date/Time   WBC 18.8* 08/26/2013 0505   RBC 3.55* 08/26/2013 0505   HGB 10.8* 08/26/2013 0505   HCT 35.4* 08/26/2013 0505   PLT 178 08/26/2013 0505   MCV 99.7 08/26/2013 0505   MCH 30.4 08/26/2013 0505   MCHC 30.5 08/26/2013 0505   RDW 21.2* 08/26/2013 0505   LYMPHSABS 0.6* 08/26/2013 0505  MONOABS 0.2 08/26/2013 0505   EOSABS 0.0 08/26/2013 0505   BASOSABS 0.0 08/26/2013 0505    CXR:  11/25 >>> Low volumes, gas under R hemidiaphragm, diffuse prominence of IS markings 11/25 >>> ET tube properly place and similar to previous findings  ASSESSMENT / PLAN:  PULMONARY A:  Acute on Chronic respiratory failure with severe baseline hypoxemia due to IPF Very high risk of peri-operative pulmonary complication Anticipated ventilator dependence and difficulty weaning post op  P:   - Current vent setting with minimal pressure support, keep current MV but repeat abg - Required  high FiO2 for oxygenation - Baseline on 10L [place] in the 70s, keep above 92% - WUA, daily SBT in am -would accept rr less 35 and baseline O2 needs on wean at 60% (baseline is 51%) -pcxr in am   CARDIOVASCULAR A: Sinus tachycardia, likely reactive  P:  - Control underlying causes - pain, volume status - IVF and Fentanyl drip  RENAL A:   Mild Hyponatremia R/o hytpovolemia  P:   - NS with K supplement at 100/hour, allow pos balance - Monitor BMET -na follow up in am   GASTROINTESTINAL A:  Perforated gastrocolic greater curvature S/p LapEx to Open Abdomen with correction of perforation Surgical Wound with JP Drainage  P:   - NPO - Protonix IV, consider drip - Surgery is following - Feeding when surgery clear  HEMATOLOGIC A:   Mild anemia without overt blood loss  P:  - Monitor CBC - Transfuse for acute blood loss of hemodynamic significance or for Hgb < 7.0 - On Lovenox for DVTs, crt to follow  INFECTIOUS A:   Leukocytosis Immunosuppressed (mycophenolate and steroids) Presumed peritonitis  P:   - Intraoperative culture send, pending result - Hold Mycophenolate peri-operatively - Zosyn and Diflucan  -if declines, add vanc  ENDOCRINE A:  Chronic steroid therapy Presumed adrenal insufficiency Risk of steroid induced hyperglycemia  P:   - Stress steroids - CBGs/SSI ordered  NEUROLOGIC A:  Pain and Sedation  P:   - Fentanyl drip   GOALS: [place] A detailed discussion was had with pt. She expressed wish to undergo surgery understanding the low likelihood of successful outcome with the following contingencies established:  1) DNR to be rescinded for OR per policy 2) DNR will be reinstated immediately post op 3) There is to be a relatively limited duration of vent support post operatively  We discussed a few days at most 4) Once we deem her resp status to be optimized post operatively, she is to be extubated with a plan for Do Not Intubate after  that 5) If unable to optimize resp status sufficiently within a few days, full comfort care and terminal extubation is to be undertaken  I have personally obtained a history, examined the patient, evaluated laboratory and imaging results, formulated the assessment and plan and placed orders. CRITICAL CARE: The patient is critically ill with multiple organ systems failure and requires high complexity decision making for assessment and support, frequent evaluation and titration of therapies, application of advanced monitoring technologies and extensive interpretation of multiple databases. Critical Care Time devoted to patient care services described in this note is 45 minutes.   Mcarthur Rossetti. Tyson Alias, MD, FACP Pgr: [phone] [facility]  Pulmonary and Critical Care Medicine [facility] Pager: [phone]  08/26/2013, 12:02 PM

## 2013-08-26 NOTE — Anesthesia Postprocedure Evaluation (Signed)
Anesthesia Post Note  Patient: Tanya Horne  Procedure(s) Performed: Procedure(s): EXPLORATORY LAPAROTOMY closure of gastric ulcer  Anesthesia type: General  Patient location: PACU  Post pain: Pain level controlled  Post assessment: Post-op Vital signs reviewed  Last Vitals: BP 131/73  Pulse 126  Temp(Src) 36.7 C (Oral)  Resp 32  SpO2 91%  Post vital signs: Reviewed  Level of consciousness: sedated  Complications: No apparent anesthesia complications

## 2013-08-27 ENCOUNTER — Inpatient Hospital Stay (HOSPITAL_COMMUNITY): Payer: Self-pay

## 2013-08-27 ENCOUNTER — Encounter (HOSPITAL_COMMUNITY): Payer: Self-pay | Admitting: General Surgery

## 2013-08-27 DIAGNOSIS — R69 Illness, unspecified: Secondary | ICD-10-CM

## 2013-08-27 DIAGNOSIS — E2749 Other adrenocortical insufficiency: Secondary | ICD-10-CM

## 2013-08-27 DIAGNOSIS — J9620 Acute and chronic respiratory failure, unspecified whether with hypoxia or hypercapnia: Secondary | ICD-10-CM

## 2013-08-27 DIAGNOSIS — IMO0002 Reserved for concepts with insufficient information to code with codable children: Secondary | ICD-10-CM

## 2013-08-27 LAB — BLOOD GAS, ARTERIAL
Acid-base deficit: 4.1 mmol/L — ABNORMAL HIGH (ref 0.0–2.0)
Bicarbonate: 22.1 mEq/L (ref 20.0–24.0)
Bicarbonate: 22 mEq/L (ref 20.0–24.0)
Drawn by: 11249
FIO2: 0.4 %
MECHVT: 400 mL
O2 Saturation: 98 %
PEEP: 5 cmH2O
Patient temperature: 98.6
TCO2: 21.6 mmol/L (ref 0–100)
TCO2: 21.5 mmol/L (ref 0–100)
pCO2 arterial: 49.2 mmHg — ABNORMAL HIGH (ref 35.0–45.0)
pH, Arterial: 7.275 — ABNORMAL LOW (ref 7.350–7.450)
pH, Arterial: 7.273 — ABNORMAL LOW (ref 7.350–7.450)
pO2, Arterial: 247 mmHg — ABNORMAL HIGH (ref 80.0–100.0)
pO2, Arterial: 157 mmHg — ABNORMAL HIGH (ref 80.0–100.0)

## 2013-08-27 LAB — GLUCOSE, CAPILLARY
Glucose-Capillary: 71 mg/dL (ref 70–99)
Glucose-Capillary: 76 mg/dL (ref 70–99)
Glucose-Capillary: 76 mg/dL (ref 70–99)
Glucose-Capillary: 79 mg/dL (ref 70–99)
Glucose-Capillary: 82 mg/dL (ref 70–99)
Glucose-Capillary: 87 mg/dL (ref 70–99)
Glucose-Capillary: 91 mg/dL (ref 70–99)

## 2013-08-27 LAB — PHOSPHORUS: Phosphorus: 4.3 mg/dL (ref 2.3–4.6)

## 2013-08-27 LAB — BASIC METABOLIC PANEL
Calcium: 8.5 mg/dL (ref 8.4–10.5)
Chloride: 103 mEq/L (ref 96–112)
GFR calc Af Amer: 88 mL/min — ABNORMAL LOW (ref >90)
GFR calc non Af Amer: 76 mL/min — ABNORMAL LOW (ref >90)
Glucose, Bld: 91 mg/dL (ref 70–99)
Potassium: 5.2 mEq/L — ABNORMAL HIGH (ref 3.5–5.1)
Sodium: 134 mEq/L — ABNORMAL LOW (ref 135–145)

## 2013-08-27 LAB — MAGNESIUM: Magnesium: 2.1 mg/dL (ref 1.5–2.5)

## 2013-08-27 LAB — CBC
HCT: 27.4 % — ABNORMAL LOW (ref 36.0–46.0)
Hemoglobin: 8.1 g/dL — ABNORMAL LOW (ref 12.0–15.0)
Hemoglobin: 8.2 g/dL — ABNORMAL LOW (ref 12.0–15.0)
MCH: 30.7 pg (ref 26.0–34.0)
MCHC: 30.3 g/dL (ref 30.0–36.0)
Platelets: 133 10*3/uL — ABNORMAL LOW (ref 150–400)
Platelets: 140 10*3/uL — ABNORMAL LOW (ref 150–400)
RBC: 2.67 MIL/uL — ABNORMAL LOW (ref 3.87–5.11)
RDW: 20.5 % — ABNORMAL HIGH (ref 11.5–15.5)
WBC: 15.8 10*3/uL — ABNORMAL HIGH (ref 4.0–10.5)

## 2013-08-27 LAB — LACTIC ACID, PLASMA: Lactic Acid, Venous: 1.2 mmol/L (ref 0.5–2.2)

## 2013-08-27 MED ORDER — SODIUM CHLORIDE 0.9 % IV SOLN
INTRAVENOUS | Status: DC
  Administered 2013-08-27: 07:00:00 via INTRAVENOUS
  Administered 2013-08-28: 50 mL via INTRAVENOUS

## 2013-08-27 NOTE — Progress Notes (Signed)
eLink Physician-Brief Progress Note Patient Name: Tanya Horne DOB: Feb 21, 1951 MRN: [id number]  Date of Service  08/27/2013   HPI/Events of Note  Hyperkalemia with potassium of 5.2 on IVFs with potassium   eICU Interventions  Change IVFs to NS at 50 cc/hr   Intervention Category Intermediate Interventions: Electrolyte abnormality - evaluation and management  [name] 08/27/2013, 6:30 AM

## 2013-08-27 NOTE — Progress Notes (Signed)
RT changed vent setting per Dr.Deterding. Changed VT from 340 to 400. Changed oxygen from 60% to 40%.

## 2013-08-27 NOTE — Progress Notes (Signed)
INITIAL NUTRITION ASSESSMENT  DOCUMENTATION CODES Per approved criteria  -Not Applicable   INTERVENTION: - If pt unable to be extubated in the next 24-48 hours, recommend initiation of trickle TF of elemental formula of Vital HP at 44ml/hr. If feeding tube unable to be placed in stomach, recommend small bowel tube placement. Goal rate of Vital HP would be 60ml/hr which would provide 1200 calories, 105g protein, free water and meet 96% estimated calorie needs and 110% estimated protein needs.  - Will continue to monitor   NUTRITION DIAGNOSIS: Inadequate oral intake related to inability to eat as evidenced by NPO, mechanical ventilation.   Goal: Initiation of enteral nutrition with goal to meet >90% of estimated nutritional needs  Monitor:  Weights, labs, vent status, TF initiation   Reason for Assessment: Ventilated pt   62 y.o. female  Admitting Dx: Perforated gastric ulcer  ASSESSMENT: Admitted from [facility] with c/o of abdominal pain. Found to have pneumoperitoneum with extravasation along the greater curvature on CT of abdomen per MD notes. Has been on hospice since July 2013 for end stage pulmonary fibrosis. S/p exploratory laparotomy, closure perforated gastric ulcer, omental patch, abdominal wound closure with retention sutures yesterday. Intubated with NGT for suction. No NGT output documented yet as it was placed this morning.   Pt able to shake head during visit. Was able to gather she lost 10 pounds unintentionally in the past 6 months and her appetite was "so-so" PTA, then pt started to fall asleep.   Patient is currently intubated on ventilator support.  MV: 7.2 L/min Temp:Temp (24hrs), Avg:98.1 F (36.7 C), Min:97.7 F (36.5 C), Max:98.8 F (37.1 C)  Propofol: off   - Sodium slightly low - Potassium elevated   Height: Ht Readings from Last 1 Encounters:  08/26/13 4\' 11"  (1.499 m)    Weight: Wt Readings from Last 1  Encounters:  08/27/13 136 lb 7.4 oz (61.9 kg)    Ideal Body Weight: 98 lb  % Ideal Body Weight: 139%  Wt Readings from Last 10 Encounters:  08/27/13 136 lb 7.4 oz (61.9 kg)  08/27/13 136 lb 7.4 oz (61.9 kg)    Usual Body Weight: 146 lb 6 months ago per pt  % Usual Body Weight: 93%  BMI:  Body mass index is 27.55 kg/(m^2).  Estimated Nutritional Needs: Kcal: 1252 Protein: 80-95g Fluid: 1.2L/day   Skin: Stage 2 sacral pressure ulcer, abdominal incision  Diet Order: NPO  EDUCATION NEEDS: -No education needs identified at this time   Intake/Output Summary (Last 24 hours) at 08/27/13 0859 Last data filed at 08/27/13 0800  Gross per 24 hour  Intake 3117.71 ml  Output    780 ml  Net 2337.71 ml    Last BM: PTA  Labs:   Recent Labs Lab 08/26/13 0505 08/27/13 0315  NA 129* 134*  K 4.1 5.2*  CL 94* 103  CO2 23 23  BUN 23 26*  CREATININE 1.01 0.81  CALCIUM 8.8 8.5  MG  --  2.1  PHOS  --  4.3  GLUCOSE 103* 91    CBG (last 3)   Recent Labs  08/27/13 0020 08/27/13 0309 08/27/13 0745  GLUCAP 76 91 79    Scheduled Meds: . albuterol  2.5 mg Nebulization Q6H  . antiseptic oral rinse  15 mL Mouth Rinse QID  . chlorhexidine  15 mL Mouth Rinse BID  . enoxaparin (LOVENOX) injection  40 mg Subcutaneous Q24H  . fluconazole (DIFLUCAN) IV  400 mg  Intravenous QAC breakfast  . hydrocortisone sod succinate (SOLU-CORTEF) inj  50 mg Intravenous Q6H  . insulin aspart  0-15 Units Subcutaneous Q4H  . [START ON 08/30/2013] pantoprazole (PROTONIX) IV  40 mg Intravenous Q12H  . piperacillin-tazobactam (ZOSYN)  IV  3.375 g Intravenous Q8H    Continuous Infusions: . sodium chloride 50 mL/hr at 08/27/13 0800  . fentaNYL infusion INTRAVENOUS 100 mcg/hr (08/26/13 1800)  . pantoprozole (PROTONIX) infusion 8 mg/hr (08/27/13 0800)    Past Medical History  Diagnosis Date  . Fibroid     History reviewed. No pertinent past surgical history.  Levon Hedger MS, RD,  LDN [phone] Pager [phone] After Hours Pager

## 2013-08-27 NOTE — Progress Notes (Addendum)
General surgery attending:   I have interviewed and examined this patient this morning. I agree with the assessment and treatment plan outlined by Mr. Marlyne Beards, [place]. Patient is stable, POD #1, closure patch and drainage of gastric perforation. She is alert and ventilator dependent, as expected. Abdomen is reasonably soft. Wounds clean. AP drainage is gland.  Continue NG suction, and JP drain suction, IV Zosyn and Diflucan.Continue double dose PPI. Management of ventilator, anemia, steroids and general medical care per CCM. We will follow closely.   Angelia Mould. Derrell Lolling, M.D., [facility] General and Minimally invasive Surgery Breast and Colorectal Surgery Office:   [phone] Pager:   [phone]

## 2013-08-27 NOTE — Progress Notes (Signed)
Pt very anxious, tachypenic & shaking hands towards ET tube. Denies pain but writes that she feels like she is choking.  Fentanyl drip resumed at and Versed 2 mg given at request for something to help her relax.

## 2013-08-27 NOTE — Progress Notes (Signed)
1 Day Post-Op  Subjective: Awake on vent, answering questions with nod of her head.  Like to have the fan on her. Denies much abdominal pain.  Objective: Vital signs in last 24 hours: Temp:  [97.7 F (36.5 C)-98.8 F (37.1 C)] 97.7 F (36.5 C) (11/26 0400) Pulse Rate:  [80-115] 87 (11/26 0700) Resp:  [9-33] 14 (11/26 0700) BP: (83-156)/(40-93) 91/54 mmHg (11/26 0700) SpO2:  [94 %-98 %] 97 % (11/26 0700) FiO2 (%):  [40 %-100 %] 40 % (11/26 0527) Weight:  [60.6 kg (133 lb 9.6 oz)-61.9 kg (136 lb 7.4 oz)] 61.9 kg (136 lb 7.4 oz) (11/26 0436) Last BM Date:  (pta) 80 ML from drain/serosanguinous in color. SBP 90-100 range  HR 80's, Sats 97-98% on Vent 0630:  7.27/49/127 K+ 5.2 Intake/Output from previous day: 11/25 0701 - 11/26 0700 In: 4012.7 [I.V.:3842.7; NG/GT:20; IV Piggyback:150] Out: 1055 [Urine:925; Drains:80; Blood:50] Intake/Output this shift:    General appearance: alert, cooperative and on Vent GI: soft, open wound clean and redressed no bowel sounds.  Lab Results:   Recent Labs  08/26/13 0505 08/27/13 0315  WBC 18.8* 15.8*  HGB 10.8* 8.1*  HCT 35.4* 26.7*  PLT 178 133*    BMET  Recent Labs  08/26/13 0505 08/27/13 0315  NA 129* 134*  K 4.1 5.2*  CL 94* 103  CO2 23 23  GLUCOSE 103* 91  BUN 23 26*  CREATININE 1.01 0.81  CALCIUM 8.8 8.5   PT/INR  Recent Labs  08/26/13 0505  LABPROT 13.3  INR 1.03     Recent Labs Lab 08/26/13 0505  AST 22  ALT 24  ALKPHOS 71  BILITOT 1.2  PROT 5.7*  ALBUMIN 3.3*     Lipase     Component Value Date/Time   LIPASE 46 08/26/2013 0505     Studies/Results: Dg Chest Portable 1 View  08/26/2013   CLINICAL DATA:  Status post intubation.  EXAM: PORTABLE CHEST - 1 VIEW  COMPARISON:  Single view of the chest 08/26/2013 at 6:56 a.m.  FINDINGS: Endotracheal tube is in place with the tip projecting 1.6 cm above the carina. NG tube courses into the stomach and below the inferior margin of the film. Lung  volumes are low with basilar atelectasis. No pneumothorax is identified. Heart size is normal.  IMPRESSION: Tip of ET tube projects 1.6 cm above the carina. NG tube tip is in the distal stomach.  Bibasilar atelectasis in a low volume chest.   Electronically Signed   By: Drusilla Kanner M.D.   On: 08/26/2013 10:54   Dg Chest Port 1 View  08/26/2013   CLINICAL DATA:  Dyspnea, history of pulmonary fibrosis, unexplained pneumoperitoneum. Preoperative study for abdominal surgery.  EXAM: PORTABLE CHEST - 1 VIEW  COMPARISON:  None.  FINDINGS: There is linear density at the right lung base just above the hemidiaphragm. This is most compatible with atelectasis. Hyperlucency however under this density is consistent with free extraluminal gas. By history, the patient has pneumoperitoneum of uncertain etiology. Both lungs are hypoinflated. There is increased density at the left lung base consistent with atelectasis. The cardiac silhouette is top-normal in size. The central pulmonary vascularity is prominent but this is in part due to crowding secondary to hypoinflation. The trachea is midline Within the upper abdomen there is gas within normal appearing loops of hepatic and splenic flexure of the colon and there is gas within the stomach.  IMPRESSION: 1. There is density at the right lung base with lucency  just beneath it consistent with pneumoperitoneum and adjacent atelectasis or fibrosis. 2. There is bilateral pulmonary hypo inflation. Increased interstitial markings in both lungs may reflect known pulmonary fibrosis, but one cannot absolutely exclude low-grade CHF in the appropriate clinical setting.   Electronically Signed   By: David  [place]   On: 08/26/2013 07:22   Ct Outside Films Body  08/26/2013   This examination belongs to an outside facility and is stored  here for comparison purposes only.  Contact the originating outside  institution for any associated report or interpretation.   Medications: .  albuterol  2.5 mg Nebulization Q6H  . antiseptic oral rinse  15 mL Mouth Rinse QID  . chlorhexidine  15 mL Mouth Rinse BID  . enoxaparin (LOVENOX) injection  40 mg Subcutaneous Q24H  . fluconazole (DIFLUCAN) IV  400 mg Intravenous QAC breakfast  . hydrocortisone sod succinate (SOLU-CORTEF) inj  50 mg Intravenous Q6H  . insulin aspart  0-15 Units Subcutaneous Q4H  . [START ON 08/30/2013] pantoprazole (PROTONIX) IV  40 mg Intravenous Q12H  . piperacillin-tazobactam (ZOSYN)  IV  3.375 g Intravenous Q8H    Assessment/Plan Perforated gastric ulcer with localized peritonitis Exploratory laparotomy, closure perforated gastric ulcer, omental patch, abdominal wound closure with retention sutures,  08/26/2013, [name], MD. Acute-on-chronic respiratory failure/Pulmonary fibrosis/Chronic use of steroids On Hospice preop for pulmonary disease Starting day 2 Zosyn & diflucan.  Plan:  Continue NG suction for now, Wet tod dry dressing to abdomen, PPI bid, antibiotics.  Will continue to follow with CCM         LOS: 1 day    Luie Laneve 08/27/2013

## 2013-08-27 NOTE — Progress Notes (Signed)
eLink Physician-Brief Progress Note Patient Name: Tanya Horne DOB: 1951-09-10 MRN: [id number]  Date of Service  08/27/2013   HPI/Events of Note  Patient on vent with ABG showing resp acidosis with pH 7.27/49/247/22   eICU Interventions  Plan: Increase TV to 400 cc Adjust FIO2 to achieve sats of 95% or greater   Intervention Category Major Interventions: Acid-Base disturbance - evaluation and management  Tanya Horne 08/27/2013, 5:14 AM

## 2013-08-27 NOTE — Progress Notes (Signed)
PULMONARY  / CRITICAL CARE MEDICINE  Name: Tanya Horne MRN: [id number] DOB: 08/12/1951    ADMISSION DATE:  08/26/2013   REFERRING MD :  EDP PRIMARY SERVICE: PCCM  CHIEF COMPLAINT:   Abdominal pain and found to have Intra-abdominal viscus perforation on CT scan  BRIEF PATIENT DESCRIPTION:  Free intraperitoneal air on CT abdomen performed @ [facility] ED. 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 Transferred to [facility] ED.  Patient was DNR and under Hospice Care since July but DNR than Hospice Care was reverse temporarily for emergent abdominal surgical intervention.  SIGNIFICANT EVENTS / STUDIES:  11/25: CT abd (@ [facility]): free intra-abdominal gas with apparent extravasation of enteral contrast from greater stomach curvature 11/25: LapEx to open abdomen and correction of greater curvature of gastrocolic area viscus perforation  Intubated for surgery support with predicted difficulty weaning given end stage IPF  LINES / TUBES: ET Tube 11/25 >>> Peripheral IVx2 11/25 >>> Foley 11/25 >>> Abdomen JP 11/25 >>> Gastric Tube 11/25 >>>  CULTURES: 11/25 Abdomen Fluid Culture >>>  Few G(+) Rods 11/25 Abdomen Fluid Anearobic Culture >>> Few G(+) Rods  ANTIBIOTICS: Fluconazole 11/25 >>  Zosyn 11/25 >>    SUBJECTIVE: Patient seen and examined this morning.  Abdomen wound was just packed and changed by surgery.  No sign of bleeding.  JP continue to drain bloody fluid.  Patient indicated that she is tired with respiratory in the room trying to wean.  She could not tolerated wean this morning with tachycardia.  She denied any pain.  VITAL SIGNS: Temp:  [97.7 F (36.5 C)-98.8 F (37.1 C)] 98.2 F (36.8 C) (11/26 0800) Pulse Rate:  [80-115] 90 (11/26 0800) Resp:  [9-33] 15 (11/26 0800) BP: (83-156)/(40-93) 106/59 mmHg (11/26 0800) SpO2:  [94 %-98 %] 97 % (11/26 0800) FiO2 (%):  [30 %-100 %] 30 % (11/26 0822) Weight:  [60.6 kg (133 lb 9.6 oz)-61.9 kg (136 lb 7.4  oz)] 61.9 kg (136 lb 7.4 oz) (11/26 0436) HEMODYNAMICS:   VENTILATOR SETTINGS: Vent Mode:  [-] PRVC FiO2 (%):  [30 %-100 %] 30 % Set Rate:  [14 bmp] 14 bmp Vt Set:  [340 mL-450 mL] 400 mL PEEP:  [5 cmH20] 5 cmH20 Plateau Pressure:  [18 cmH20-27 cmH20] 26 cmH20 INTAKE / OUTPUT: Intake/Output     11/25 0701 - 11/26 0700 11/26 0701 - 11/27 0700   I.V. (mL/kg) 3842.7 (62.1) 5 (0.1)   NG/GT 20    IV Piggyback 150 100   Total Intake(mL/kg) 4012.7 (64.8) 105 (1.7)   Urine (mL/kg/hr) 925 (0.6) 50 (0.5)   Drains 80 (0.1)    Blood 50 (0)    Total Output 1055 50   Net +2957.7 +55          PHYSICAL EXAMINATION: General:  Ill appearing female lying in bed following commands and interacting without talking due to ETT Neuro:  Vented, minimal sedation, interacting this morning and follow commands HEENT: Cushingoid facies, otherwise WNL Cardiovascular: NSR, s1 and s2 heard, no murmur/rub/gallop Lungs: continue to be coarse on vent machine, no wheeze Abdomen: New bandage on surgical site, no drainage on bandage, JP in place, bloody drainage remains Ext: warn, no edema    LABS: CBC    Component Value Date/Time   WBC 15.8* 08/27/2013 0315   RBC 2.62* 08/27/2013 0315   HGB 8.1* 08/27/2013 0315   HCT 26.7* 08/27/2013 0315   PLT 133* 08/27/2013 0315   MCV 101.9* 08/27/2013 0315  MCH 30.9 08/27/2013 0315   MCHC 30.3 08/27/2013 0315   RDW 20.5* 08/27/2013 0315   LYMPHSABS 0.6* 08/26/2013 0505   MONOABS 0.2 08/26/2013 0505   EOSABS 0.0 08/26/2013 0505   BASOSABS 0.0 08/26/2013 0505    CXR:  11/25 >>> Low volumes, gas under R hemidiaphragm, diffuse prominence of IS markings 11/25 >>> ET tube properly place and similar to previous findings 11/26 >>> Low lung volume, proper tube placement, no interval changes  ASSESSMENT / PLAN:  PULMONARY A:  Acute on Chronic respiratory failure with severe baseline hypoxemia due to IPF Very high risk of peri-operative pulmonary  complication Anticipated ventilator dependence and difficulty weaning postop  P:   - CXR this AM without interval changes, follow retrocardiac infiltrate? Likely is fibrosis related - Acidotic overnight and VT increased to 400 from 340 - No pH changes on repeat gas after vent settings changed overnight, increase rate 22 - Can likely lower FiO2 below 40% - Failed weaning this morning, continue intermittent throughout the day, goal cpap 5p s 8 , would accept rr 35 rate and TV 250 - Daily WUA and SBT  CARDIOVASCULAR A: Sinus tachycardia, likely reactive   --> Improved today  P:  - Control underlying causes - pain, volume status - IVF and Fentanyl drip  RENAL A:   Hyponatremia - Resolved Hyperkalemia mild Elevated BUN  P:   - Was on IVF with K for hyponatremia and hypokalemia - Became hyperkalemic overnight, dc k in bag, done - IVF changes to NS without potassium - No EKG changes with K at 5.1 - BMET in morning and monitor -correct ph, see rat eincrease  GASTROINTESTINAL A:  Perforated gastrocolic greater curvature S/p LapEx to Open Abdomen with correction of perforation Surgical Wound with JP Drainage  P:   - NPO, continue NG - Protonix IV drip, to bid in am  - Feeding when surgery clear  HEMATOLOGIC A:   Acute on Chronic Anemia  --> No evidence of bleeding currently  --> dilution? Thrombocytopenia Leukocytosis  P:  - More than 2 units drop on CBC this AM compare to yesterday - On Lovenox for DVTs, normal renal function currently - Platelets drop from 178 to 133, reactive/HIT? - Repeat CBC stat, transfuse if hgb <7  INFECTIOUS A:   Leukocytosis Immunosuppressed (mycophenolate) Presumed peritonitis  P:   - Intraoperative culture send, now showed few GPR - Hold Mycophenolate peri-operatively - Zosyn and Diflucan, will consider addition stop date for 7 days diflucan - WBC improving  - On Cortef, continue to monitor  ENDOCRINE A:  Chronic steroid  therapy Presumed adrenal insufficiency Risk of steroid induced hyperglycemia  P:   - Stress steroids, was on home steroids - CBGs/SSI ordered  NEUROLOGIC A:  Pain and Sedation  P:   - Fentanyl drip -WUA  I have personally obtained a history, examined the patient, evaluated laboratory and imaging results, formulated the assessment and plan and placed orders. CRITICAL CARE: The patient is critically ill with multiple organ systems failure and requires high complexity decision making for assessment and support, frequent evaluation and titration of therapies, application of advanced monitoring technologies and extensive interpretation of multiple databases. Critical Care Time devoted to patient care services described in this note is 35 minutes.   Mcarthur Rossetti. Tyson Alias, MD, FACP Pgr: [phone] [facility]  08/27/2013, 8:41 AM

## 2013-08-28 ENCOUNTER — Inpatient Hospital Stay (HOSPITAL_COMMUNITY): Payer: Self-pay

## 2013-08-28 DIAGNOSIS — K255 Chronic or unspecified gastric ulcer with perforation: Principal | ICD-10-CM

## 2013-08-28 DIAGNOSIS — R0902 Hypoxemia: Secondary | ICD-10-CM

## 2013-08-28 DIAGNOSIS — J8410 Pulmonary fibrosis, unspecified: Secondary | ICD-10-CM

## 2013-08-28 LAB — BODY FLUID CULTURE

## 2013-08-28 LAB — BLOOD GAS, ARTERIAL
Drawn by: 31814
FIO2: 0.3 %
O2 Saturation: 94.4 %
PEEP: 5 cmH2O
RATE: 22 resp/min
pCO2 arterial: 43.8 mmHg (ref 35.0–45.0)
pH, Arterial: 7.311 — ABNORMAL LOW (ref 7.350–7.450)
pO2, Arterial: 87.4 mmHg (ref 80.0–100.0)

## 2013-08-28 LAB — CBC WITH DIFFERENTIAL/PLATELET
Eosinophils Absolute: 0 10*3/uL (ref 0.0–0.7)
Eosinophils Relative: 0 % (ref 0–5)
HCT: 24.7 % — ABNORMAL LOW (ref 36.0–46.0)
Hemoglobin: 7.4 g/dL — ABNORMAL LOW (ref 12.0–15.0)
Lymphocytes Relative: 1 % — ABNORMAL LOW (ref 12–46)
Lymphs Abs: 0.1 10*3/uL — ABNORMAL LOW (ref 0.7–4.0)
MCH: 30.3 pg (ref 26.0–34.0)
MCHC: 30 g/dL (ref 30.0–36.0)
MCV: 101.2 fL — ABNORMAL HIGH (ref 78.0–100.0)
Monocytes Absolute: 0.3 10*3/uL (ref 0.1–1.0)
Monocytes Relative: 3 % (ref 3–12)
Neutrophils Relative %: 96 % — ABNORMAL HIGH (ref 43–77)
RBC: 2.44 MIL/uL — ABNORMAL LOW (ref 3.87–5.11)
WBC: 9.9 10*3/uL (ref 4.0–10.5)

## 2013-08-28 LAB — BASIC METABOLIC PANEL
BUN: 30 mg/dL — ABNORMAL HIGH (ref 6–23)
CO2: 22 mEq/L (ref 19–32)
Calcium: 8.8 mg/dL (ref 8.4–10.5)
Glucose, Bld: 95 mg/dL (ref 70–99)
Potassium: 4.6 mEq/L (ref 3.5–5.1)
Sodium: 136 mEq/L (ref 135–145)

## 2013-08-28 LAB — GLUCOSE, CAPILLARY
Glucose-Capillary: 87 mg/dL (ref 70–99)
Glucose-Capillary: 68 mg/dL — ABNORMAL LOW (ref 70–99)
Glucose-Capillary: 74 mg/dL (ref 70–99)

## 2013-08-28 MED ORDER — HYDROCORTISONE SOD SUCCINATE 100 MG IJ SOLR
25.0000 mg | Freq: Four times a day (QID) | INTRAMUSCULAR | Status: DC
  Administered 2013-08-28 – 2013-08-29 (×4): 25 mg via INTRAVENOUS
  Filled 2013-08-28 (×4): qty 0.5

## 2013-08-28 MED ORDER — DEXTROSE-NACL 5-0.9 % IV SOLN
INTRAVENOUS | Status: DC
  Administered 2013-08-28 – 2013-08-29 (×2): via INTRAVENOUS

## 2013-08-28 NOTE — Progress Notes (Signed)
PULMONARY  / CRITICAL CARE MEDICINE  Name: Tanya Horne MRN: [id number] DOB: 07-02-1951    ADMISSION DATE:  08/26/2013   REFERRING MD :  EDP PRIMARY SERVICE: PCCM  CHIEF COMPLAINT:   Abdominal pain and found to have Intra-abdominal viscus perforation on CT scan  BRIEF PATIENT DESCRIPTION:  Free intraperitoneal air on CT abdomen performed @ [facility] ED. 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 Transferred to [facility] ED.  Patient was DNR and under Hospice Care since July but DNR than Hospice Care was reverse temporarily for emergent abdominal surgical intervention.  SIGNIFICANT EVENTS / STUDIES:  11/25: CT abd (@ [facility]): free intra-abdominal gas with apparent extravasation of enteral contrast from greater stomach curvature 11/25: LapEx to open abdomen and correction of greater curvature of gastrocolic area viscus perforation  Intubated for surgery support with predicted difficulty weaning given end stage IPF 11/26:  Hemoglobin level drop from 10.8 to 8.1, repeat level ruled out lab error 11/27:  Hemoglobin continue to drop and it is 7.4 today, no apparent source of bleeding beside abdomen drainage in JP and bandages  LINES / TUBES: ET Tube 11/25 >>> Peripheral IVx2 11/25 >>> Foley 11/25 >>> Abdomen JP 11/25 >>> Gastric Tube 11/25 >>>  CULTURES: 11/25 Abdomen Fluid Culture >>>  Few G(+) Rods 11/25 Abdomen Fluid Anearobic Culture >>> Few G(+) Rods  ANTIBIOTICS: Fluconazole 11/25 >>  Zosyn 11/25 >>    SUBJECTIVE: Patient seen and examined this morning.  Patient was alert and appear comfortable despite ET tube remains.  She communicated using pen and paper.  She indicated that she has minimal pain, surgical site hurt a little when she cough.  She has no other complaints.  VITAL SIGNS: Temp:  [97.3 F (36.3 C)-98.2 F (36.8 C)] 97.3 F (36.3 C) (11/27 0400) Pulse Rate:  [78-118] 79 (11/27 0600) Resp:  [13-25] 15 (11/27 0600) BP: (86-156)/(47-77)  122/59 mmHg (11/27 0600) SpO2:  [90 %-100 %] 98 % (11/27 0600) FiO2 (%):  [30 %-40 %] 30 % (11/27 0423) Weight:  [62.3 kg (137 lb 5.6 oz)] 62.3 kg (137 lb 5.6 oz) (11/27 0500) HEMODYNAMICS:   VENTILATOR SETTINGS: Vent Mode:  [-] PRVC FiO2 (%):  [30 %-40 %] 30 % Set Rate:  [14 bmp-22 bmp] 22 bmp Vt Set:  [400 mL] 400 mL PEEP:  [5 cmH20] 5 cmH20 Plateau Pressure:  [16 cmH20-30 cmH20] 30 cmH20 INTAKE / OUTPUT: Intake/Output     11/26 0701 - 11/27 0700 11/27 0701 - 11/28 0700   I.V. (mL/kg) 1996.8 (32.1)    NG/GT     IV Piggyback 225    Total Intake(mL/kg) 2221.8 (35.7)    Urine (mL/kg/hr) 820 (0.5)    Emesis/NG output 50 (0)    Drains 28 (0)    Other 160 (0.1)    Blood     Total Output 1058     Net +1163.8            PHYSICAL EXAMINATION: General:  Intubated female sitting up in bed in no acute distress Neuro:  Vented, minimal sedation, interacting this morning and follow commands HEENT: Cushingoid facies, otherwise WNL Cardiovascular: NSR, s1 and s2 heard, no murmur/rub/gallop Lungs: vented with clear breath sounds, no wheeze Abdomen: New bandage on surgical site, no drainage on bandage, JP in place, bloody drainage remains Ext: warn, no edema    LABS: CBC    Component Value Date/Time   WBC 9.9 08/28/2013 0325   RBC 2.44* 08/28/2013  0325   HGB 7.4* 08/28/2013 0325   HCT 24.7* 08/28/2013 0325   PLT 121* 08/28/2013 0325   MCV 101.2* 08/28/2013 0325   MCH 30.3 08/28/2013 0325   MCHC 30.0 08/28/2013 0325   RDW 20.4* 08/28/2013 0325   LYMPHSABS 0.1* 08/28/2013 0325   MONOABS 0.3 08/28/2013 0325   EOSABS 0.0 08/28/2013 0325   BASOSABS 0.0 08/28/2013 0325    CXR:  11/25 >>> Low volumes, gas under R hemidiaphragm, diffuse prominence of IS markings 11/25 >>> ET tube properly place and similar to previous findings 11/26 >>> Low lung volume, proper tube placement, no interval changes 11/27 >>> ET tube unchage, atelectasis and mild edema  ASSESSMENT /  PLAN:  PULMONARY A:  Acute on Chronic respiratory failure with severe baseline hypoxemia due to IPF Very high risk of peri-operative pulmonary complication Anticipated ventilator dependence and difficulty weaning postop Mild pulmonary edema  P:   - CXR this AM reviewed, re eval in am  - Acidotic state improved with increased in mV overnight, avoid high rates with lung dz, avoid alk - Currently tachycardic but on 30%FiO2 since yesterday morning - Goal CPAP 5 PS 8 , would accept RR 35 rate and TV 250, have d/w RT - Daily WUA and SBT  CARDIOVASCULAR A: Sinus tachycardia, likely reactive   --> Improved today  P:  - Control underlying causes - pain, volume status - IVF and Fentanyl drip  RENAL A:   Hyponatremia - Resolved Hyperkalemia - Resoved Elevated BUN  P:   - BUN continue to remains high, from steroids likely - chem in am  -no lasix planned  GASTROINTESTINAL A:  Perforated gastrocolic greater curvature S/p LapEx to Open Abdomen with correction of perforation Surgical Wound with JP Drainage  P:   - NPO, continue NG - Protonix IV BID, dc drip -initiate feeding when surgery allows   HEMATOLOGIC A:   Acute on Chronic Anemia Thrombocytopenia mild Leukocytosis - Resolved  P:  - Hgb continue to drop slowly and at 7.4 today -no role blood tx - On Lovenox for DVTs, normal renal function currently - Platelets continue to drop from 140 to 121 today, dilutional   INFECTIOUS A:   Leukocytosis - Resolved Immunosuppressed (mycophenolate) Presumed peritonitis  P:   - Intraoperative culture send, now showed few GPR pending final ID/sensitivity - Hold Mycophenolate  - Zosyn and Diflucan, will consider addition stop date for 7 days of Diflucan-done - On Cortef, continue to monitor  ENDOCRINE A:  Chronic steroid therapy Presumed adrenal insufficiency Risk of steroid induced hyperglycemia  P:   - Stress steroids, was on home steroids, will reduce today -  CBGs/SSI ordered  NEUROLOGIC A:  Pain and Sedation  P:   - Fentanyl drip - WUA -upright if able  I have personally obtained a history, examined the patient, evaluated laboratory and imaging results, formulated the assessment and plan and placed orders. CRITICAL CARE: The patient is critically ill with multiple organ systems failure and requires high complexity decision making for assessment and support, frequent evaluation and titration of therapies, application of advanced monitoring technologies and extensive interpretation of multiple databases. Critical Care Time devoted to patient care services described in this note is 30 minutes.   Mcarthur Rossetti. Tyson Alias, MD, FACP Pgr: [phone] [facility]  Mcarthur Rossetti. Tyson Alias, MD, FACP Pgr: [phone] [facility]  08/28/2013, 7:43 AM

## 2013-08-28 NOTE — Progress Notes (Signed)
eLink Physician-Brief Progress Note Patient Name: Tanya Horne DOB: 08/18/1951 MRN: [id number]  Date of Service  08/28/2013   HPI/Events of Note   hypoglycemia  eICU Interventions  Add dextrose to fluids   Intervention Category Intermediate Interventions: Other:  [name] V. 08/28/2013, 4:27 PM

## 2013-08-28 NOTE — Progress Notes (Signed)
Patient ID: Tanya Horne, female   DOB: Oct 17, 1950, 62 y.o.   MRN: [id number]  General Surgery - [facility] - Progress Note  POD# 2  Subjective: Patient in ICU on vent.  Awake and alert.  Wants "a cup of tea"!  Mild pain.  Objective: Vital signs in last 24 hours: Temp:  [97.3 F (36.3 C)-98 F (36.7 C)] 97.3 F (36.3 C) (11/27 0400) Pulse Rate:  [70-118] 70 (11/27 0800) Resp:  [13-25] 18 (11/27 0800) BP: (86-156)/(47-77) 103/71 mmHg (11/27 0800) SpO2:  [90 %-100 %] 98 % (11/27 0800) FiO2 (%):  [30 %] 30 % (11/27 0423) Weight:  [137 lb 5.6 oz (62.3 kg)] 137 lb 5.6 oz (62.3 kg) (11/27 0500) Last BM Date:  (pta)  Intake/Output from previous day: 11/26 0701 - 11/27 0700 In: 2221.8 [I.V.:1996.8; IV Piggyback:225] Out: 1058 [Urine:820; Emesis/NG output:50; Drains:28]  Exam: HEENT - clear, not icteric Neck - soft Chest - coarse bilaterally Cor - RRR, no murmur Abd - soft without distension; no BS present; dressing dry and intact; JP with thin serosanguinous Ext - no significant edema Neuro - grossly intact, no focal deficits  Lab Results:   Recent Labs  08/27/13 1045 08/28/13 0325  WBC 15.4* 9.9  HGB 8.2* 7.4*  HCT 27.4* 24.7*  PLT 140* 121*     Recent Labs  08/27/13 0315 08/28/13 0325  NA 134* 136  K 5.2* 4.6  CL 103 103  CO2 23 22  GLUCOSE 91 95  BUN 26* 30*  CREATININE 0.81 0.68  CALCIUM 8.5 8.8    Studies/Results: Dg Chest Port 1 View  08/28/2013   CLINICAL DATA:  Endotracheal tube position.  EXAM: PORTABLE CHEST - 1 VIEW  COMPARISON:  08/27/2013 and 08/26/2013.  FINDINGS: 0522 hr. The endotracheal tube is not significantly changed, 1.9 cm above the carina. Nasogastric tube projects into the distal stomach. There are persistent low lung volumes with bibasilar atelectasis and diffusely increased interstitial markings. No developing focal airspace disease or pneumothorax is identified. There may be a small amount of pleural fluid  on the right. The heart size and mediastinal contours are stable.  IMPRESSION: Endotracheal tube is unchanged in lower trachea, approximately 1.9 cm above the carina. No significant change in appearance of the lungs, likely due to a combination of atelectasis and mild edema.   Electronically Signed   By: Roxy Horseman M.D.   On: 08/28/2013 07:52   Dg Chest Port 1 View  08/27/2013   CLINICAL DATA:  Endotracheal tube position check.  EXAM: PORTABLE CHEST - 1 VIEW  COMPARISON:  08/26/2013.  FINDINGS: Endotracheal tube noted with tip 12 mm above the carina. NG tube noted with its tip below the left hemidiaphragm. Low lung volumes with basilar atelectasis. No pleural effusion or pneumothorax. Heart size normal.  IMPRESSION: 1. Stable support lines. 2. Poor lung volumes with basilar atelectasis.   Electronically Signed   By: Maisie Fus  Register   On: 08/27/2013 07:19   Dg Chest Portable 1 View  08/26/2013   CLINICAL DATA:  Status post intubation.  EXAM: PORTABLE CHEST - 1 VIEW  COMPARISON:  Single view of the chest 08/26/2013 at 6:56 a.m.  FINDINGS: Endotracheal tube is in place with the tip projecting 1.6 cm above the carina. NG tube courses into the stomach and below the inferior margin of the film. Lung volumes are low with basilar atelectasis. No pneumothorax is identified. Heart size is normal.  IMPRESSION: Tip of ET tube projects 1.6  cm above the carina. NG tube tip is in the distal stomach.  Bibasilar atelectasis in a low volume chest.   Electronically Signed   By: Drusilla Kanner M.D.   On: 08/26/2013 10:54    Assessment / Plan: 1.  Status post gastric ulcer closure  NPO, NG decompression  Leave JP drain in place until eating  Change dressing as needed  Per CCM  Velora Heckler, MD, [facility] Office: [phone]  08/28/2013

## 2013-08-29 ENCOUNTER — Inpatient Hospital Stay (HOSPITAL_COMMUNITY): Payer: Self-pay

## 2013-08-29 LAB — CBC
Hemoglobin: 8.3 g/dL — ABNORMAL LOW (ref 12.0–15.0)
MCH: 30.5 pg (ref 26.0–34.0)
MCHC: 30.5 g/dL (ref 30.0–36.0)
Platelets: 126 10*3/uL — ABNORMAL LOW (ref 150–400)
RDW: 19.9 % — ABNORMAL HIGH (ref 11.5–15.5)
WBC: 7.6 10*3/uL (ref 4.0–10.5)

## 2013-08-29 LAB — GLUCOSE, CAPILLARY
Glucose-Capillary: 74 mg/dL (ref 70–99)
Glucose-Capillary: 75 mg/dL (ref 70–99)
Glucose-Capillary: 75 mg/dL (ref 70–99)
Glucose-Capillary: 75 mg/dL (ref 70–99)
Glucose-Capillary: 79 mg/dL (ref 70–99)
Glucose-Capillary: 81 mg/dL (ref 70–99)
Glucose-Capillary: 84 mg/dL (ref 70–99)

## 2013-08-29 LAB — BASIC METABOLIC PANEL
BUN: 27 mg/dL — ABNORMAL HIGH (ref 6–23)
CO2: 22 mEq/L (ref 19–32)
Calcium: 8.5 mg/dL (ref 8.4–10.5)
Chloride: 104 mEq/L (ref 96–112)
Glucose, Bld: 81 mg/dL (ref 70–99)
Sodium: 137 mEq/L (ref 135–145)

## 2013-08-29 MED ORDER — HYDROCORTISONE SOD SUCCINATE 100 MG IJ SOLR
25.0000 mg | Freq: Two times a day (BID) | INTRAMUSCULAR | Status: DC
  Administered 2013-08-29 – 2013-09-02 (×8): 25 mg via INTRAVENOUS
  Filled 2013-08-29 (×8): qty 0.5

## 2013-08-29 MED ORDER — AMPICILLIN-SULBACTAM SODIUM 1.5 (1-0.5) G IJ SOLR
1.5000 g | Freq: Four times a day (QID) | INTRAMUSCULAR | Status: AC
  Administered 2013-08-29 – 2013-09-02 (×16): 1.5 g via INTRAVENOUS
  Filled 2013-08-29 (×16): qty 1.5

## 2013-08-29 NOTE — Progress Notes (Addendum)
ANTIBIOTIC CONSULT NOTE - Follow-up  Pharmacy Consult for Fluconazole, Unasyn Indication: intra-abdominal coverage  Allergies  Allergen Reactions  . Sulfa Antibiotics Hives    Patient Measurements: Height: 4\' 11"  (149.9 cm) Weight: 137 lb 5.6 oz (62.3 kg) IBW/kg (Calculated) : 43.2  Vital Signs: Temp: 97.4 F (36.3 C) (11/28 0400) Temp src: Oral (11/28 0400) BP: 141/70 mmHg (11/28 0700) Pulse Rate: 85 (11/28 0700) Intake/Output from previous day: 11/27 0701 - 11/28 0700 In: 1654 [I.V.:1094; IV Piggyback:375] Out: 1220 [Urine:945; Emesis/NG output:100] Intake/Output from this shift:    Labs:  Recent Labs  08/27/13 0315 08/27/13 1045 08/28/13 0325 08/29/13 0315  WBC 15.8* 15.4* 9.9  --   HGB 8.1* 8.2* 7.4*  --   PLT 133* 140* 121*  --   CREATININE 0.81  --  0.68 0.58   Estimated Creatinine Clearance: 58.5 ml/min (by C-G formula based on Cr of 0.58). No results found for this basename: VANCOTROUGH, VANCOPEAK, VANCORANDOM, GENTTROUGH, GENTPEAK, GENTRANDOM, TOBRATROUGH, TOBRAPEAK, TOBRARND, AMIKACINPEAK, AMIKACINTROU, AMIKACIN,  in the last 72 hours   Microbiology: Recent Results (from the past 720 hour(s))  ANAEROBIC CULTURE     Status: None   Collection Time    08/26/13  8:33 AM      Result Value Range Status   Specimen Description PERITONEAL   Final   Special Requests PERITONEAL FLD ON SWABS   Final   Gram Stain     Final   Value: MODERATE WBC PRESENT,BOTH PMN AND MONONUCLEAR     FEW GRAM POSITIVE RODS     Gram Stain Report Called to,Read Back By and Verified With: Gram Stain Report Called to,Read Back By and Verified With: ERIN BULL RN 08/26/13 2:50PM BY MILSH     Performed at [facility]   Culture     Final   Value: NO ANAEROBES ISOLATED; CULTURE IN PROGRESS FOR 5 DAYS     Performed at [facility]   Report Status PENDING   Incomplete  BODY FLUID CULTURE     Status: None   Collection Time    08/26/13  8:33 AM      Result Value Range  Status   Specimen Description PERITONEAL   Final   Special Requests PERITONEAL FLD ON SWAB   Final   Gram Stain     Final   Value: MODERATE WBC PRESENT,BOTH PMN AND MONONUCLEAR     FEW GRAM POSITIVE RODS     Gram Stain Report Called to,Read Back By and Verified With: Gram Stain Report Called to,Read Back By and Verified With: ERIN BULL RN 08/26/13 2:50PM BY MILSH     Performed at [facility]   Culture     Final   Value: Multiple bacterial morphotypes present, none predominant. Suggest appropriate recollection if clinically indicated.     Performed at [facility]   Report Status 08/28/2013 FINAL   Final    Medical History: Past Medical History  Diagnosis Date  . Fibroid     Assessment: 69 [facility]M [facility] with [facility]/o abd pain. CT showed pnuemoperitoneum with extravasation along the greater curvature of the stomach.  Surgery and CCM on board. Pt has been on Hospice since July 2013 but wishes to undergo surgery. MD started Zosyn and ordered Fluconazole per pharmacy dosing.   D#4 Antifungal/Antibiotics 11/25 >> Zosyn (MD) >> 11/28 11/25 >> Fluconazole >> 11/28 >> Unasyn >>  Tmax: afeb WBCs: Improved, now WNL Renal: Scr 0.58, stable.  CrCl  58  Cultures: 11/25: Peritoneal fluid: Gram stain: few GPRs;  Cx: multiple bacterial, none predominant (Final) 11/25: Peritoneal anaerobic: Gram stain: few GPRs;  Cx: no anaerobes isolated (Pending)   Plan:  Continue fluconazole 400 mg IV q24h Start Unasyn 1.5g IV q 6 hours F/u renal function, CBC, clinical course   Haynes Hoehn, PharmD 08/29/2013, 9:41 AM  Pager: [phone]

## 2013-08-29 NOTE — Progress Notes (Signed)
PULMONARY  / CRITICAL CARE MEDICINE  Name: Tanya Horne MRN: [id number] DOB: Feb 24, 1951    ADMISSION DATE:  08/26/2013   REFERRING MD :  EDP PRIMARY SERVICE: PCCM  CHIEF COMPLAINT:   Abdominal pain and found to have Intra-abdominal viscus perforation on CT scan  BRIEF PATIENT DESCRIPTION:  Free intraperitoneal air on CT abdomen performed @ [facility] ED. 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 Transferred to [facility] ED.  Patient was DNR and under Hospice Care since July but DNR than Hospice Care was reverse temporarily for emergent abdominal surgical intervention.  SIGNIFICANT EVENTS / STUDIES:  11/25: CT abd (@ [facility]): free intra-abdominal gas with apparent extravasation of enteral contrast from greater stomach curvature 11/25: LapEx to open abdomen and correction of greater curvature of gastrocolic area viscus perforation  Intubated for surgery support with predicted difficulty weaning given end stage IPF 11/26:  Hemoglobin level drop from 10.8 to 8.1, repeat level ruled out lab error 11/27:  Hemoglobin continue to drop and it is 7.4 today, no apparent source of bleeding beside abdomen drainage in JP and bandages  LINES / TUBES: ET Tube 11/25 >>> Peripheral IVx2 11/25 >>> Foley 11/25 >>> Abdomen JP 11/25 >>> Gastric Tube 11/25 >>>  CULTURES: 11/25 Abdomen Fluid Culture >>>  Few G(+) Rods 11/25 Abdomen Fluid Anearobic Culture >>> Few G(+) Rods  ANTIBIOTICS: Fluconazole 11/25 >>  Zosyn 11/25 >>>11/28 11/28 unasyn>>>  SUBJECTIVE: Patient seen and examined this morning.  Patient was alert and appear comfortable despite ET tube remains.  She nodded and indicated that she is uncomfortable with vent but is doing ok.  She denied any pain.  VITAL SIGNS: Temp:  [97.3 F (36.3 C)-98.5 F (36.9 C)] 97.4 F (36.3 C) (11/28 0400) Pulse Rate:  [70-116] 85 (11/28 0700) Resp:  [17-32] 22 (11/28 0700) BP: (116-159)/(56-83) 141/70 mmHg (11/28 0700) SpO2:  [96  %-100 %] 99 % (11/28 0700) FiO2 (%):  [30 %] 30 % (11/28 0755) HEMODYNAMICS:   VENTILATOR SETTINGS: Vent Mode:  [-] PRVC FiO2 (%):  [30 %] 30 % Set Rate:  [22 bmp] 22 bmp Vt Set:  [400 mL] 400 mL PEEP:  [5 cmH20] 5 cmH20 Plateau Pressure:  [26 cmH20-29 cmH20] 26 cmH20 INTAKE / OUTPUT: Intake/Output     11/27 0701 - 11/28 0700 11/28 0701 - 11/29 0700   I.V. (mL/kg) 1094 (17.6)    Other 185    IV Piggyback 375    Total Intake(mL/kg) 1654 (26.5)    Urine (mL/kg/hr) 945 (0.6)    Emesis/NG output 100 (0.1)    Drains     Other 175 (0.1)    Total Output 1220     Net +434            PHYSICAL EXAMINATION: General:  Intubated female lying in bed in no acute distress Neuro:  Vented, minimal sedation, interacting this morning and communicating with non verbal actions HEENT: Cushingoid facies, otherwise WNL Cardiovascular: NSR, s1 and s2 heard, no murmur/rub/gallop Lungs: vented with clear breath sounds, no wheeze Abdomen: Bandage on surgical site, JP in place with bloody drainage remains                  NG tube continue to have bile color drainage Ext: slightly cool, no edema    LABS: CBC    Component Value Date/Time   WBC 9.9 08/28/2013 0325   RBC 2.44* 08/28/2013 0325   HGB 7.4* 08/28/2013 0325   HCT  24.7* 08/28/2013 0325   PLT 121* 08/28/2013 0325   MCV 101.2* 08/28/2013 0325   MCH 30.3 08/28/2013 0325   MCHC 30.0 08/28/2013 0325   RDW 20.4* 08/28/2013 0325   LYMPHSABS 0.1* 08/28/2013 0325   MONOABS 0.3 08/28/2013 0325   EOSABS 0.0 08/28/2013 0325   BASOSABS 0.0 08/28/2013 0325    CXR:  11/28 >>> Poor lung volume, atelectasis  ASSESSMENT / PLAN:  PULMONARY A:  Acute on Chronic respiratory failure with severe baseline hypoxemia due to IPF Very high risk of peri-operative pulmonary complication Anticipated ventilator dependence and difficulty weaning postop Mild pulmonary edema/Atelectasis  P:   - CXR this AM reviewed, no interval changes, re eval in am  -  Currently mild tachycardic but on 30%FiO2 with PEEP 12 day #3 - Goal CPAP 5 PS 5-8 , would accept RR 40 tv 250 if able on wean - Daily WUA and SBT, goal today 30 min , will re assess dni status as well with planned extubation -may need lasix  CARDIOVASCULAR A: Sinus tachycardia, likely reactive   P:  - Control underlying causes - pain, volume status - IVF and Fentanyl drip -will have mild tachy with restriction  RENAL A:   Hyponatremia - Resolved Hyperkalemia - Resoved Elevated BUN  P:   - BUN continue to remains high, from steroids likely - Daily BMET monitoring - consider lasix to net neg 500 cc balance  GASTROINTESTINAL A:  Perforated gastrocolic greater curvature S/p LapEx to Open Abdomen with correction of perforation Surgical Wound with JP Drainage NG tube with continuous suction - bile color drainage remains  P:   - Continue NPO and NG suction - Protonix IV BID, dc drip - Initiate feeding when surgery allows  -may need tpn, would like to avoid this, re eval daily for ability to trickle feeds  HEMATOLOGIC A:   Acute on Chronic Anemia Thrombocytopenia mild Leukocytosis - Resolved  P:  - Hgb at 7.4 yesterday, repeat stat - No role blood tx - On Lovenox for DVTs, normal renal function currently - Monitoring platelets with CBC in am   INFECTIOUS A:   Leukocytosis - Resolved Immunosuppressed (mycophenolate) Presumed peritonitis  P:   - Intraoperative culture send, now showed few GPR pending final ID/sensitivity - Hold Mycophenolate  - Diflucan, will consider addition stop date for 7 days - Last day of antibiotics on 09/01/13 - On Cortef, continue to monitor -dc zosyn, add unasyn  ENDOCRINE A:  Chronic steroid therapy Presumed adrenal insufficiency Risk of steroid induced hyperglycemia  P:   - Stress steroids, was on home steroids, will reduce today - CBGs/SSI ordered  NEUROLOGIC A:  Pain and Sedation  P:   - Fentanyl drip - WUA - Upright  if able  I have personally obtained a history, examined the patient, evaluated laboratory and imaging results, formulated the assessment and plan and placed orders. CRITICAL CARE: The patient is critically ill with multiple organ systems failure and requires high complexity decision making for assessment and support, frequent evaluation and titration of therapies, application of advanced monitoring technologies and extensive interpretation of multiple databases. Critical Care Time devoted to patient care services described in this note is 30 minutes.    08/29/2013, 8:23 AM Mcarthur Rossetti. Tyson Alias, MD, FACP Pgr: [phone] [facility]

## 2013-08-29 NOTE — Progress Notes (Signed)
CARE MANAGEMENT NOTE 08/29/2013  Patient:  Tanya Horne, Tanya Horne   Account Number:  [id number]  Date Initiated:  08/26/2013  Documentation initiated by:  DAVIS,RHONDA  Subjective/Objective Assessment:   pt with end stage pulmonary fibrosis and now with gastric perforation requiring surgical intervention.  On vent for full resp support s/p surg.     Action/Plan:   patient is a patient of [facility]. RN is Renea Ee at [phone] or the main office at [phone]   Anticipated DC Date:  09/01/2013   Anticipated DC Plan:  HOME W HOSPICE CARE  In-house referral  Clinical Social Worker      DC Planning Services  CM consult      PAC Choice  HOSPICE   Choice offered to / List presented to:  NA   DME arranged  NA      DME agency  [facility]     HH arranged  NA      HH agency  [facility]   Status of service:  In process, will continue to follow Medicare Important Message given?  NA - LOS <3 / Initial given by admissions (If response is "NO", the following Medicare IM given date fields will be blank) Date Medicare IM given:   Date Additional Medicare IM given:    Discharge Disposition:    Per UR Regulation:  Reviewed for med. necessity/level of care/duration of stay  If discussed at Long Length of Stay Meetings, dates discussed:    Comments:  [id number]/Rhonda Stark Jock, BSN, [place] [phone] Chart Reviewed for discharge and hospital needs. Discharge needs at time of review:  None present will follow for needs. pod#3 and vent day#3 Review of patient progress due on [id number].   [id number] Earlene Plater, RN, BSN, CCN: [phone] Case management. Chart reviewed for discharge planning and present needs. Discharge needs: none present at time of review.  PLEASE SEE ABOVE NOTE FOR HOSPICE CONTACTS. Next chart review due:  [id number]

## 2013-08-29 NOTE — Progress Notes (Signed)
Patient ID: Tanya Horne, female   DOB: 1950-12-08, 62 y.o.   MRN: [id number]  General Surgery - [facility] - Progress Note  POD# 3  Subjective: Patient awake and alert.  No complaints.  Weaning trial this AM.  Denies pain.  Objective: Vital signs in last 24 hours: Temp:  [97.1 F (36.2 C)-98.5 F (36.9 C)] 97.4 F (36.3 C) (11/28 0400) Pulse Rate:  [70-113] 82 (11/28 0426) Resp:  [18-32] 22 (11/28 0426) BP: (103-159)/(56-83) 116/63 mmHg (11/28 0426) SpO2:  [96 %-100 %] 96 % (11/28 0426) FiO2 (%):  [30 %] 30 % (11/28 0426) Last BM Date:  (pta)  Intake/Output from previous day: 11/27 0701 - 11/28 0700 In: 927 [I.V.:602; IV Piggyback:325] Out: 700 [Urine:425]  Exam: HEENT - clear, not icteric Neck - soft Chest - clear bilaterally Cor - RRR, no murmur Abd - soft without distension; BS present; dressing dry and intact; JP with thin serosanguinous Ext - no significant edema Neuro - grossly intact, no focal deficits  Lab Results:   Recent Labs  08/27/13 1045 08/28/13 0325  WBC 15.4* 9.9  HGB 8.2* 7.4*  HCT 27.4* 24.7*  PLT 140* 121*     Recent Labs  08/28/13 0325 08/29/13 0315  NA 136 137  K 4.6 4.1  CL 103 104  CO2 22 22  GLUCOSE 95 81  BUN 30* 27*  CREATININE 0.68 0.58  CALCIUM 8.8 8.5    Studies/Results: Dg Chest Port 1 View  08/29/2013   CLINICAL DATA:  Intubation.  EXAM: PORTABLE CHEST - 1 VIEW  COMPARISON:  08/28/2013.  FINDINGS: Endotracheal tube tip is at the level of the carina, proximal repositioning suggested. NG tube projected over stomach. Poor lung volumes with basilar atelectasis and/or pneumonia. Heart size normal. No pleural effusion or pneumothorax.  IMPRESSION: 1. Endotracheal tube tip at the level of the carina, proximal repositioning suggested. 2. Poor lung volumes with basilar atelectasis. These results were called by telephone at the time of interpretation on 08/29/2013 at 7:20 AM to the patient's ICU nurse,  Jairo Ben who verbally acknowledged these results.   Electronically Signed   By: Maisie Fus  Register   On: 08/29/2013 07:25   Dg Chest Port 1 View  08/28/2013   CLINICAL DATA:  Endotracheal tube position.  EXAM: PORTABLE CHEST - 1 VIEW  COMPARISON:  08/27/2013 and 08/26/2013.  FINDINGS: 0522 hr. The endotracheal tube is not significantly changed, 1.9 cm above the carina. Nasogastric tube projects into the distal stomach. There are persistent low lung volumes with bibasilar atelectasis and diffusely increased interstitial markings. No developing focal airspace disease or pneumothorax is identified. There may be a small amount of pleural fluid on the right. The heart size and mediastinal contours are stable.  IMPRESSION: Endotracheal tube is unchanged in lower trachea, approximately 1.9 cm above the carina. No significant change in appearance of the lungs, likely due to a combination of atelectasis and mild edema.   Electronically Signed   By: Roxy Horseman M.D.   On: 08/28/2013 07:52    Assessment / Plan: 1.  Status post repair gastric perforation  NPO - appears to be resolving ileus  Leave drain in place until eating  Monitor wound  Weaning per CCM  Velora Heckler, MD, [facility] Office: [phone]  08/29/2013

## 2013-08-29 NOTE — Progress Notes (Signed)
Received call from Radiology this am to pull back on ETT 4cm.  RT made aware.

## 2013-08-30 LAB — BASIC METABOLIC PANEL
BUN: 13 mg/dL (ref 6–23)
BUN: 9 mg/dL (ref 6–23)
CO2: 30 mEq/L (ref 19–32)
Calcium: 8.1 mg/dL — ABNORMAL LOW (ref 8.4–10.5)
Calcium: 8.3 mg/dL — ABNORMAL LOW (ref 8.4–10.5)
Chloride: 102 mEq/L (ref 96–112)
Creatinine, Ser: 0.45 mg/dL — ABNORMAL LOW (ref 0.50–1.10)
Creatinine, Ser: 0.46 mg/dL — ABNORMAL LOW (ref 0.50–1.10)
GFR calc Af Amer: >90 mL/min (ref >90)
GFR calc Af Amer: >90 mL/min (ref >90)
GFR calc non Af Amer: >90 mL/min (ref >90)
Potassium: 2.9 mEq/L — ABNORMAL LOW (ref 3.5–5.1)
Sodium: 135 mEq/L (ref 135–145)

## 2013-08-30 LAB — GLUCOSE, CAPILLARY
Glucose-Capillary: 61 mg/dL — ABNORMAL LOW (ref 70–99)
Glucose-Capillary: 82 mg/dL (ref 70–99)
Glucose-Capillary: 96 mg/dL (ref 70–99)
Glucose-Capillary: 77 mg/dL (ref 70–99)

## 2013-08-30 LAB — CBC
HCT: 27.5 % — ABNORMAL LOW (ref 36.0–46.0)
Hemoglobin: 8.2 g/dL — ABNORMAL LOW (ref 12.0–15.0)
MCH: 29.4 pg (ref 26.0–34.0)
MCHC: 29.8 g/dL — ABNORMAL LOW (ref 30.0–36.0)
MCV: 98.6 fL (ref 78.0–100.0)
Platelets: 131 10*3/uL — ABNORMAL LOW (ref 150–400)
RBC: 2.79 MIL/uL — ABNORMAL LOW (ref 3.87–5.11)
RDW: 19.4 % — ABNORMAL HIGH (ref 11.5–15.5)
WBC: 7.4 10*3/uL (ref 4.0–10.5)

## 2013-08-30 MED ORDER — POTASSIUM CHLORIDE 10 MEQ/100ML IV SOLN
10.0000 meq | INTRAVENOUS | Status: AC
  Administered 2013-08-30 (×4): 10 meq via INTRAVENOUS
  Filled 2013-08-30 (×4): qty 100

## 2013-08-30 MED ORDER — POTASSIUM CHLORIDE 20 MEQ/15ML (10%) PO LIQD
40.0000 meq | Freq: Once | ORAL | Status: AC
  Administered 2013-08-30: 40 meq
  Filled 2013-08-30: qty 30

## 2013-08-30 MED ORDER — VITAL AF 1.2 CAL PO LIQD
1000.0000 mL | ORAL | Status: DC
  Administered 2013-08-30: 1000 mL
  Filled 2013-08-30 (×2): qty 1000

## 2013-08-30 MED ORDER — FUROSEMIDE 10 MG/ML IJ SOLN
40.0000 mg | Freq: Once | INTRAMUSCULAR | Status: AC
  Administered 2013-08-30: 40 mg via INTRAVENOUS
  Filled 2013-08-30: qty 4

## 2013-08-30 MED ORDER — POTASSIUM CHLORIDE 10 MEQ/50ML IV SOLN: 10.0000 meq | INTRAVENOUS | Status: DC

## 2013-08-30 MED ORDER — DEXTROSE 50 % IV SOLN
INTRAVENOUS | Status: AC
  Administered 2013-08-30: 04:00:00
  Filled 2013-08-30: qty 50

## 2013-08-30 MED ORDER — DEXTROSE 50 % IV SOLN
25.0000 mL | Freq: Once | INTRAVENOUS | Status: AC | PRN
  Administered 2013-08-30: 25 mL via INTRAVENOUS

## 2013-08-30 MED ORDER — POTASSIUM CHLORIDE 10 MEQ/50ML IV SOLN
10.0000 meq | INTRAVENOUS | Status: DC
  Filled 2013-08-30: qty 50

## 2013-08-30 MED ORDER — VITAMINS A & D EX OINT
TOPICAL_OINTMENT | CUTANEOUS | Status: AC
  Administered 2013-08-31
  Filled 2013-08-30: qty 5

## 2013-08-30 NOTE — Progress Notes (Signed)
Patient ID: Tanya Horne, female   DOB: 12-25-1950, 62 y.o.   MRN: [id number]  General Surgery - [facility] - Progress Note  POD# 4  Subjective: Patient awake and alert on vent in ICU.  Weaning in progress.  No complaints.  Denies abdominal pain.  Objective: Vital signs in last 24 hours: Temp:  [97 F (36.1 C)-97.7 F (36.5 C)] 97.5 F (36.4 C) (11/29 0800) Pulse Rate:  [70-139] 80 (11/29 0900) Resp:  [15-31] 22 (11/29 0900) BP: (121-171)/(58-91) 171/66 mmHg (11/29 0900) SpO2:  [81 %-100 %] 99 % (11/29 0900) FiO2 (%):  [30 %-40 %] 30 % (11/29 0926) Weight:  [145 lb 8.1 oz (66 kg)] 145 lb 8.1 oz (66 kg) (11/29 0400) Last BM Date:  (pta)  Intake/Output from previous day: 11/28 0701 - 11/29 0700 In: 1701.7 [I.V.:1301.7; IV Piggyback:400] Out: 2005 [Urine:1515; Emesis/NG output:200; Drains:290]  Exam: HEENT - clear, not icteric Neck - soft Chest - coarse bilaterally Cor - RRR, no murmur Abd - soft without distension; few BS present; wound clear and dry; JP with thin serous fluid Ext - no significant edema  Lab Results:   Recent Labs  08/29/13 1030 08/30/13 0608  WBC 7.6 7.4  HGB 8.3* 8.2*  HCT 27.2* 27.5*  PLT 126* 131*     Recent Labs  08/29/13 0315 08/30/13 0608  NA 137 139  K 4.1 2.9*  CL 104 102  CO2 22 23  GLUCOSE 81 97  BUN 27* 13  CREATININE 0.58 0.45*  CALCIUM 8.5 8.1*    Studies/Results: Dg Chest Port 1 View  08/29/2013   CLINICAL DATA:  Intubation.  EXAM: PORTABLE CHEST - 1 VIEW  COMPARISON:  08/28/2013.  FINDINGS: Endotracheal tube tip is at the level of the carina, proximal repositioning suggested. NG tube projected over stomach. Poor lung volumes with basilar atelectasis and/or pneumonia. Heart size normal. No pleural effusion or pneumothorax.  IMPRESSION: 1. Endotracheal tube tip at the level of the carina, proximal repositioning suggested. 2. Poor lung volumes with basilar atelectasis. These results were called by  telephone at the time of interpretation on 08/29/2013 at 7:20 AM to the patient's ICU nurse, Jairo Ben who verbally acknowledged these results.   Electronically Signed   By: Maisie Fus  Register   On: 08/29/2013 07:25    Assessment / Plan: 1.  Status repair gastric perforation  OK to begin trickle feeds into stomach per NG tube - discussed with Dr. Vassie Loll  Per CCM  Will follow  Velora Heckler, MD, [facility] Office: [phone]  08/30/2013

## 2013-08-30 NOTE — Progress Notes (Signed)
PULMONARY  / CRITICAL CARE MEDICINE  Name: Tanya Horne MRN: [id number] DOB: March 08, 1951    ADMISSION DATE:  08/26/2013   REFERRING MD :  EDP PRIMARY SERVICE: PCCM  CHIEF COMPLAINT:   Abdominal pain and found to have Intra-abdominal viscus perforation on CT scan  BRIEF PATIENT DESCRIPTION:  Free intraperitoneal air on CT abdomen performed @ [facility] ED. 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 Transferred to [facility] ED.  Patient was DNR and under Hospice Care since July but DNR than Hospice Care was reversed temporarily for emergent abdominal surgical intervention.  SIGNIFICANT EVENTS / STUDIES:  11/25: CT abd (@ [facility]): free intra-abdominal gas with apparent extravasation of enteral contrast from greater stomach curvature 11/25: LapEx to open abdomen and correction of greater curvature of gastrocolic area viscus perforation  Intubated for surgery support with predicted difficulty weaning given end stage IPF 11/26:  Hemoglobin level drop from 10.8 to 8.1, repeat level ruled out lab error 11/27:  Hemoglobin continue to drop and it is 7.4 today, no apparent source of bleeding beside abdomen drainage in JP and bandages  LINES / TUBES: ET Tube 11/25 >>> Peripheral IVx2 11/25 >>> Foley 11/25 >>> Abdomen JP 11/25 >>> Gastric Tube 11/25 >>>  CULTURES: 11/25 Abdomen Fluid Culture >>>  Few G(+) Rods 11/25 Abdomen Fluid Anearobic Culture >>> Few G(+) Rods  ANTIBIOTICS: Fluconazole 11/25 >>  Zosyn 11/25 >>>11/28 11/28 unasyn>>>  SUBJECTIVE: Afebrile  She denied any pain. Slight high BP Pos 5L   VITAL SIGNS: Temp:  [97 F (36.1 C)-97.7 F (36.5 C)] 97.5 F (36.4 C) (11/29 0800) Pulse Rate:  [70-139] 80 (11/29 0900) Resp:  [15-31] 22 (11/29 0900) BP: (121-171)/(58-91) 171/66 mmHg (11/29 0900) SpO2:  [81 %-100 %] 99 % (11/29 0900) FiO2 (%):  [30 %-40 %] 30 % (11/29 0926) Weight:  [66 kg (145 lb 8.1 oz)] 66 kg (145 lb 8.1 oz) (11/29  0400) HEMODYNAMICS:   VENTILATOR SETTINGS: Vent Mode:  [-] PRVC FiO2 (%):  [30 %-40 %] 30 % Set Rate:  [22 bmp] 22 bmp Vt Set:  [400 mL] 400 mL PEEP:  [5 cmH20] 5 cmH20 Plateau Pressure:  [25 cmH20-37 cmH20] 37 cmH20 INTAKE / OUTPUT: Intake/Output     11/28 0701 - 11/29 0700 11/29 0701 - 11/30 0700   I.V. (mL/kg) 1301.7 (19.7) 55 (0.8)   Other     IV Piggyback 400 200   Total Intake(mL/kg) 1701.7 (25.8) 255 (3.9)   Urine (mL/kg/hr) 1515 (1) 115 (0.6)   Emesis/NG output 200 (0.1)    Drains 290 (0.2)    Total Output 2005 115   Net -303.3 +140        Stool Occurrence 1 x      PHYSICAL EXAMINATION: General:  Intubated female lying in bed in no acute distress Neuro:  Vented, minimal sedation, interacting this morning and communicating with non verbal actions HEENT: Cushingoid facies, otherwise WNL Cardiovascular: NSR, s1 and s2 heard, no murmur/rub/gallop Lungs: vented with clear breath sounds, no wheeze Abdomen: Bandage on surgical site, JP in place with bloody drainage remains                  NG tube continue to have bile color drainage Ext: slightly cool, no edema    LABS: CBC    Component Value Date/Time   WBC 7.4 08/30/2013 0608   RBC 2.79* 08/30/2013 0608   HGB 8.2* 08/30/2013 0608   HCT 27.5* 08/30/2013 8657  PLT 131* 08/30/2013 0608   MCV 98.6 08/30/2013 0608   MCH 29.4 08/30/2013 0608   MCHC 29.8* 08/30/2013 0608   RDW 19.4* 08/30/2013 0608   LYMPHSABS 0.1* 08/28/2013 0325   MONOABS 0.3 08/28/2013 0325   EOSABS 0.0 08/28/2013 0325   BASOSABS 0.0 08/28/2013 0325    CXR:  11/28 >>> Poor lung volume, atelectasis  ASSESSMENT / PLAN:  PULMONARY A:  Acute on Chronic respiratory failure with severe baseline hypoxemia due to IPF Very high risk of peri-operative pulmonary complication Anticipated ventilator dependence and difficulty weaning postop Mild pulmonary edema/Atelectasis  P:   - Daily WUA and SBT, goal today 30 min , will re assess dni status  as well with planned extubation - neg balance  CARDIOVASCULAR A: Sinus tachycardia, likely reactive   P:  - Control underlying causes - pain, volume status - IVF and Fentanyl drip -will have mild tachy with restriction  RENAL A:   Hyponatremia - Resolved Hyperkalemia - Resolved, now hypokalemic Elevated BUN  P:   - Daily BMET monitoring -  lasix to net neg 500 cc balance -replete K aggressively  GASTROINTESTINAL A:  Perforated gastrocolic greater curvature S/p LapEx to Open Abdomen with correction of perforation Surgical Wound with JP Drainage  P:   -  Protonix IV BID, dc drip - Initiate trickle feeding , d/w surgery  -may need tpn, would like to avoid this  HEMATOLOGIC A:   Acute on Chronic Anemia Thrombocytopenia mild Leukocytosis - Resolved  P:  - Hgb at 7.4 yesterday, repeat stat - transfuse for <7 - On Lovenox for DVTs, normal renal function currently - Monitoring platelets with CBC in am   INFECTIOUS A:   Leukocytosis - Resolved Immunosuppressed (mycophenolate) Presumed peritonitis  P:   -  Hold Mycophenolate  - Diflucan, will consider addition stop date for 7 days - Last day of antibiotics on 09/01/13 - On Cortef, continue to monitor -dc zosyn,ct  unasyn  ENDOCRINE A:  Chronic steroid therapy Presumed adrenal insufficiency Risk of steroid induced hyperglycemia  P:   - Stress steroids, was on home steroids - CBGs/SSI ordered  NEUROLOGIC A:  Pain and Sedation  P:   - Fentanyl drip - WUA - Upright if able  I have personally obtained a history, examined the patient, evaluated laboratory and imaging results, formulated the assessment and plan and placed orders. CRITICAL CARE: The patient is critically ill with multiple organ systems failure and requires high complexity decision making for assessment and support, frequent evaluation and titration of therapies, application of advanced monitoring technologies and extensive interpretation of  multiple databases. Critical Care Time devoted to patient care services described in this note is 35 minutes.    08/30/2013, 9:56 AM Cyril Mourning MD. FCCP. [facility] Pager [phone] If no response call [phone]

## 2013-08-30 NOTE — Progress Notes (Signed)
pts 4 a.m. CBG 60. Pt alert. Initiated hypoglycemia protocol and administered 25 mL of 50% Dextrose. Recheck CBG 108. Will continue to monitor

## 2013-08-30 NOTE — Progress Notes (Signed)
Per RN Oneita Hurt- Dr. Vassie Loll placed PT on Wean post increase fentanyl (after first failed SBT). PT was placed on 5/5 at approx. 1030 and RN increased PS to 10cm at approx 1115.

## 2013-08-30 NOTE — Progress Notes (Signed)
eLink Physician-Brief Progress Note Patient Name: Tanya Horne DOB: 1951/07/01 MRN: [id number]  Date of Service  08/30/2013   HPI/Events of Note   K 2.9  eICU Interventions  KCL supp IV   Intervention Category Intermediate Interventions: Electrolyte abnormality - evaluation and management  Shan Levans 08/30/2013, 11:44 PM

## 2013-08-30 NOTE — Progress Notes (Signed)
PT failed SBT (increased HR, RR , anxiety, low Spo2 to 79%), placed back on full support.- RT worked PS up to 25cm with change in PT condition. RN aware and is resuming sedation.

## 2013-08-31 ENCOUNTER — Inpatient Hospital Stay (HOSPITAL_COMMUNITY): Payer: Self-pay

## 2013-08-31 LAB — ANAEROBIC CULTURE

## 2013-08-31 LAB — BASIC METABOLIC PANEL
BUN: 9 mg/dL (ref 6–23)
CO2: 30 mEq/L (ref 19–32)
Chloride: 97 mEq/L (ref 96–112)
Creatinine, Ser: 0.45 mg/dL — ABNORMAL LOW (ref 0.50–1.10)
GFR calc non Af Amer: >90 mL/min (ref >90)
Glucose, Bld: 106 mg/dL — ABNORMAL HIGH (ref 70–99)
Sodium: 137 mEq/L (ref 135–145)

## 2013-08-31 LAB — MAGNESIUM: Magnesium: 1.6 mg/dL (ref 1.5–2.5)

## 2013-08-31 LAB — GLUCOSE, CAPILLARY
Glucose-Capillary: 99 mg/dL (ref 70–99)
Glucose-Capillary: 100 mg/dL — ABNORMAL HIGH (ref 70–99)
Glucose-Capillary: 64 mg/dL — ABNORMAL LOW (ref 70–99)

## 2013-08-31 MED ORDER — POTASSIUM CHLORIDE 10 MEQ/100ML IV SOLN
10.0000 meq | INTRAVENOUS | Status: AC
  Administered 2013-08-31 (×4): 10 meq via INTRAVENOUS
  Filled 2013-08-31 (×3): qty 100

## 2013-08-31 MED ORDER — DEXTROSE 10 % IV SOLN
INTRAVENOUS | Status: DC
  Administered 2013-08-31 – 2013-09-02 (×4): via INTRAVENOUS

## 2013-08-31 MED ORDER — POTASSIUM CHLORIDE 10 MEQ/100ML IV SOLN
INTRAVENOUS | Status: AC
  Filled 2013-08-31: qty 100

## 2013-08-31 MED ORDER — FUROSEMIDE 10 MG/ML IJ SOLN
40.0000 mg | Freq: Once | INTRAMUSCULAR | Status: AC
  Administered 2013-08-31: 40 mg via INTRAVENOUS
  Filled 2013-08-31: qty 4

## 2013-08-31 MED ORDER — POTASSIUM CHLORIDE 10 MEQ/50ML IV SOLN: 10.0000 meq | INTRAVENOUS | Status: DC

## 2013-08-31 MED ORDER — FENTANYL CITRATE 0.05 MG/ML IJ SOLN: 25.0000 ug | INTRAMUSCULAR | Status: DC | PRN

## 2013-08-31 MED ORDER — DEXTROSE 50 % IV SOLN
INTRAVENOUS | Status: AC
  Administered 2013-08-31: 25 mL
  Filled 2013-08-31: qty 50

## 2013-08-31 MED ORDER — POTASSIUM CHLORIDE 10 MEQ/100ML IV SOLN
10.0000 meq | INTRAVENOUS | Status: AC
  Administered 2013-08-31 (×3): 10 meq via INTRAVENOUS
  Filled 2013-08-31 (×3): qty 100

## 2013-08-31 NOTE — Progress Notes (Signed)
Patient ID: Tanya Horne, female   DOB: 1951/06/24, 62 y.o.   MRN: [id number]  General Surgery - [facility] - Progress Note  POD# 5  Subjective: Patient awakens to voice, alert, responsive.  No complaints of abdominal pain.  TF's started at minimal rate yesterday.  Objective: Vital signs in last 24 hours: Temp:  [97 F (36.1 C)-98.3 F (36.8 C)] 97.3 F (36.3 C) (11/30 0400) Pulse Rate:  [77-117] 101 (11/30 0800) Resp:  [14-24] 18 (11/30 0800) BP: (111-178)/(59-93) 113/63 mmHg (11/30 0800) SpO2:  [95 %-100 %] 99 % (11/30 0800) FiO2 (%):  [30 %] 30 % (11/30 0800) Weight:  [128 lb 8.5 oz (58.3 kg)] 128 lb 8.5 oz (58.3 kg) (11/30 0200) Last BM Date: 08/30/13  Intake/Output from previous day: 11/29 0701 - 11/30 0700 In: 1975 [I.V.:315; NG/GT:460; IV Piggyback:1200] Out: 4010 [Urine:3950; Drains:60]  Exam: HEENT - clear, not icteric Neck - soft Chest - coarse bilaterally Cor - RRR, no murmur Abd - soft without distension; dressing dry and intact; JP with serous fluid output; no tenderness Ext - no significant edema Neuro - grossly intact, no focal deficits  Lab Results:   Recent Labs  08/29/13 1030 08/30/13 0608  WBC 7.6 7.4  HGB 8.3* 8.2*  HCT 27.2* 27.5*  PLT 126* 131*     Recent Labs  08/30/13 2001 08/31/13 0326  NA 135 137  K 2.9* 3.4*  CL 95* 97  CO2 30 30  GLUCOSE 104* 106*  BUN 9 9  CREATININE 0.46* 0.45*  CALCIUM 8.3* 8.3*    Studies/Results: Dg Chest Port 1 View  08/31/2013   CLINICAL DATA:  Evaluate interstitial lung disease  EXAM: PORTABLE CHEST - 1 VIEW  COMPARISON:  08/29/2013; 08/28/2013; 08/26/2013  FINDINGS: Grossly unchanged borderline enlarged cardiac silhouette and mediastinal contours. Interval retraction of endotracheal tube with tip now approximately 3.1 cm above the carina. Otherwise, stable position of support apparatus.  Minimally improved inspiratory effort with persistent bibasilar predominant coarse  heterogeneous airspace opacities, right greater than left. No definite evidence of edema. Trace bilateral effusions are suspected. Grossly unchanged bones.  IMPRESSION: 1. Interval retraction of endotracheal tube with tip now approximately 3.1 cm above the carina. No pneumothorax. 2. Minimally improved aeration of the lungs with persistent diffuse basilar predominant coarse reticular heterogeneous opacities, findings compatible with provided history of pulmonary fibrosis, though note, underlying pulmonary edema and/or infection is not excluded.   Electronically Signed   By: Simonne Come M.D.   On: 08/31/2013 08:20    Assessment / Plan: 1.  Status post ex lap with repair gastric perforation  OK to advance TF's as tolerated  May consider PANDA placement under fluoro tomorrow  Monitor drain output  Weaning per CCM  Continue Unasyn, Diflucan post op  Velora Heckler, MD, [facility] Office: [phone]  08/31/2013

## 2013-08-31 NOTE — Procedures (Signed)
Extubation Procedure Note  Patient Details:   Name: Tanya Horne DOB: 1951-05-11 MRN: [id number]   Airway Documentation:  Airway 7 mm (Active)  Secured at (cm) 20 cm 08/31/2013  8:49 AM  Measured From Lips 08/31/2013  8:49 AM  Secured Location Left 08/31/2013  4:07 AM  Secured By [employer] 08/31/2013  8:49 AM  Tube Holder Repositioned Yes 08/31/2013  8:49 AM  Cuff Pressure (cm H2O) 24 cm H2O 08/30/2013  8:28 PM  Site Condition Dry 08/27/2013 12:26 PM    Evaluation  O2 sats: stable throughout Complications: No apparent complications Patient did tolerate procedure well. Bilateral Breath Sounds: Coarse crackles Suctioning: Airway Yes  Dairl Ponder Nannette 08/31/2013, 10:32 AM

## 2013-08-31 NOTE — Progress Notes (Signed)
Mary RN witnessed the waste of 240cc of Fentanyl IV bag in the sink.

## 2013-08-31 NOTE — Progress Notes (Signed)
Hypoglycemic Event  CBG: 64  Treatment: D50 IV 25 mL  Symptoms: None  Follow-up CBG: Time:1739 CBG Result:132  Possible Reasons for Event: Inadequate meal intake  Comments/MD notified:Elink notified. Possibly due to NPO, Tube Feeds Stopped, Refusing to take anything PO. Possibly switch fluids to include D5W.     Lezlie Lye C  Remember to initiate Hypoglycemia Order Set & complete

## 2013-08-31 NOTE — Progress Notes (Addendum)
Notified Dr. Ronna Polio of CBG 122.  Okay to hold insulin at this time due to previous recent hypoglycemic event.  Okay to leave NG in place due to low oral intake.  Okay to leave foley in to monitor urine output.  Will re-evaluate need for NG and foley in AM rounding.  No other concerns at this time.  Patient resting comfortably.  Will continue to monitor.  Barrie Lyme RN 8:50 PM 08/31/2013  NG was clamped at 0930 this morning however patient has not wanted any oral intake until now.  Is currently sipping on Sprite.  Will leave NG clamped and in place on patient until AM rounding 09/01/13.

## 2013-08-31 NOTE — Progress Notes (Signed)
Pt refused mouth care and repositioning while on the vent. Once extubated, pt still refused turning. Did give her a bath and adjust in bed while trying to put the bed in a chair position and the pt refused it to go the full chair position. Only made it about half way. Tried to advance the patients diet per order with ice as she is currently NPO with tube feedings stopped post extubation. Pt tolerated ice well but refused anything else to drink. Tried to give juice vs. Dextrose during hypoglycemic event, but she refused the juice then. Pt is stable. Will continue to monitor and pass along findings in report

## 2013-08-31 NOTE — Progress Notes (Signed)
eLink Physician-Brief Progress Note Patient Name: Tanya Horne DOB: [birth year] MRN: [id number]  Date of Service  08/31/2013   HPI/Events of Note   Hypoglycemia  eICU Interventions   D10@50    Intervention Category Intermediate Interventions: Other:  Lonia Farber 08/31/2013, 5:56 PM

## 2013-08-31 NOTE — Progress Notes (Addendum)
PULMONARY  / CRITICAL CARE MEDICINE  Name: Tanya Horne MRN: [id number] DOB: 1951-09-01    ADMISSION DATE:  08/26/2013   REFERRING MD :  EDP PRIMARY SERVICE: PCCM  CHIEF COMPLAINT:   Abdominal pain and found to have Intra-abdominal viscus perforation on CT scan  BRIEF PATIENT DESCRIPTION:  Free intraperitoneal air on CT abdomen performed @ [facility] ED. 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 Transferred to [facility] ED.  Patient was DNR and under Hospice Care since July but DNR than Hospice Care was reversed temporarily for emergent abdominal surgical intervention.  SIGNIFICANT EVENTS / STUDIES:  11/25: CT abd (@ [facility]): free intra-abdominal gas with apparent extravasation of enteral contrast from greater stomach curvature 11/25: LapEx to open abdomen and correction of greater curvature of gastrocolic area viscus perforation  Intubated for surgery support with predicted difficulty weaning given end stage IPF 11/26:  Hemoglobin level drop from 10.8 to 8.1, repeat level ruled out lab error 11/27:  Hemoglobin continue to drop and it is 7.4 today, no apparent source of bleeding beside abdomen drainage in JP and bandages  LINES / TUBES: ET Tube 11/25 >>>11/30 Peripheral IVx2 11/25 >>> Foley 11/25 >>> Abdomen JP 11/25 >>> Gastric Tube 11/25 >>>  CULTURES: 11/25 Abdomen Fluid Culture >>>  Few G(+) Rods 11/25 Abdomen Fluid Anearobic Culture >>> Few G(+) Rods  ANTIBIOTICS: Fluconazole 11/25 >>  Zosyn 11/25 >>>11/28 11/28 unasyn>>>  SUBJECTIVE: Afebrile Denies pain. Slight high BP Good UO with lasix   VITAL SIGNS: Temp:  [97 F (36.1 C)-98.3 F (36.8 C)] 97.8 F (36.6 C) (11/30 0800) Pulse Rate:  [77-117] 96 (11/30 1000) Resp:  [15-24] 19 (11/30 1000) BP: (111-178)/(59-93) 158/81 mmHg (11/30 1000) SpO2:  [95 %-100 %] 100 % (11/30 1000) FiO2 (%):  [30 %-40 %] 40 % (11/30 1000) Weight:  [58.3 kg (128 lb 8.5 oz)] 58.3 kg (128 lb 8.5 oz) (11/30  0200) HEMODYNAMICS:   VENTILATOR SETTINGS: Vent Mode:  [-] CPAP;PSV FiO2 (%):  [30 %-40 %] 40 % Set Rate:  [22 bmp] 22 bmp Vt Set:  [400 mL] 400 mL PEEP:  [5 cmH20] 5 cmH20 Pressure Support:  [5 cmH20-10 cmH20] 5 cmH20 Plateau Pressure:  [17 cmH20-39 cmH20] 39 cmH20 INTAKE / OUTPUT: Intake/Output     11/29 0701 - 11/30 0700 11/30 0701 - 12/01 0700   I.V. (mL/kg) 315 (5.4) 7 (0.1)   NG/GT 460 40   IV Piggyback 1200 200   Total Intake(mL/kg) 1975 (33.9) 247 (4.2)   Urine (mL/kg/hr) 3950 (2.8) 190 (0.9)   Emesis/NG output     Drains 60 (0) 50 (0.2)   Total Output 4010 240   Net -2035 +7        Stool Occurrence 1 x      PHYSICAL EXAMINATION: General:  Intubated female lying in bed in no acute distress Neuro:  Vented, minimal sedation, interacting this morning and communicating with non verbal actions HEENT: Cushingoid facies, otherwise WNL Cardiovascular: NSR, s1 and s2 heard, no murmur/rub/gallop Lungs: vented with clear breath sounds, no wheeze Abdomen: Bandage on surgical site, JP in place with bloody drainage remains                  NG tube continue to have bile color drainage Ext: slightly cool, no edema    LABS: CBC    Component Value Date/Time   WBC 7.4 08/30/2013 0608   RBC 2.79* 08/30/2013 0608   HGB 8.2* 08/30/2013 4098  HCT 27.5* 08/30/2013 0608   PLT 131* 08/30/2013 0608   MCV 98.6 08/30/2013 0608   MCH 29.4 08/30/2013 0608   MCHC 29.8* 08/30/2013 0608   RDW 19.4* 08/30/2013 0608   LYMPHSABS 0.1* 08/28/2013 0325   MONOABS 0.3 08/28/2013 0325   EOSABS 0.0 08/28/2013 0325   BASOSABS 0.0 08/28/2013 0325    CXR:  11/28 >>> Poor lung volume, atelectasis  ASSESSMENT / PLAN:  PULMONARY A:  Acute on Chronic respiratory failure with severe baseline hypoxemia due to IPF Very high risk of peri-operative pulmonary complication Anticipated ventilator dependence and difficulty weaning postop Mild pulmonary edema/Atelectasis  P:   - proceed with  extubation -tolerates PS 5/5 - neg balance  CARDIOVASCULAR A: Sinus tachycardia, likely reactive   P:  - Control underlying causes - pain, volume status  RENAL A:   Hyponatremia - Resolved Hyperkalemia - Resolved, now hypokalemic Elevated BUN  P:   - Daily BMET monitoring -  lasix to net neg  balance -replete K aggressively  GASTROINTESTINAL A:  Perforated gastrocolic greater curvature S/p LapEx to Open Abdomen with correction of perforation Surgical Wound with JP Drainage  P:   -  Protonix IV BID  -start PO once extubated  HEMATOLOGIC A:   Acute on Chronic Anemia Thrombocytopenia mild Leukocytosis - Resolved  P:  - transfuse for <7 - On Lovenox for DVTs, normal renal function currently - Monitoring platelets   INFECTIOUS A:   Leukocytosis - Resolved Immunosuppressed (mycophenolate) Presumed peritonitis  P:   -  Hold Mycophenolate  - Diflucan x 7 days - Last day of antibiotics on 09/01/13 -dc zosyn,ct  unasyn  ENDOCRINE A:  Chronic steroid therapy Presumed adrenal insufficiency Risk of steroid induced hyperglycemia  P:   - Stress steroids, was on home steroids - CBGs/SSI ordered  NEUROLOGIC A:  Pain and Sedation  P:   - Fentanyl prn  Summary - extubated, clarified with patient - NO reintubation if worse  I have personally obtained a history, examined the patient, evaluated laboratory and imaging results, formulated the assessment and plan and placed orders. CRITICAL CARE: The patient is critically ill with multiple organ systems failure and requires high complexity decision making for assessment and support, frequent evaluation and titration of therapies, application of advanced monitoring technologies and extensive interpretation of multiple databases. Critical Care Time devoted to patient care services described in this note is 35 minutes.    08/31/2013, 10:28 AM Cyril Mourning MD. FCCP. [facility] Pager [phone] If  no response call [phone]

## 2013-09-01 LAB — GLUCOSE, CAPILLARY
Glucose-Capillary: 132 mg/dL — ABNORMAL HIGH (ref 70–99)
Glucose-Capillary: 113 mg/dL — ABNORMAL HIGH (ref 70–99)
Glucose-Capillary: 109 mg/dL — ABNORMAL HIGH (ref 70–99)
Glucose-Capillary: 100 mg/dL — ABNORMAL HIGH (ref 70–99)
Glucose-Capillary: 103 mg/dL — ABNORMAL HIGH (ref 70–99)

## 2013-09-01 LAB — CBC
HCT: 30.4 % — ABNORMAL LOW (ref 36.0–46.0)
MCHC: 31.3 g/dL (ref 30.0–36.0)
MCV: 96.8 fL (ref 78.0–100.0)
Platelets: 145 10*3/uL — ABNORMAL LOW (ref 150–400)
RBC: 3.14 MIL/uL — ABNORMAL LOW (ref 3.87–5.11)
RDW: 19.7 % — ABNORMAL HIGH (ref 11.5–15.5)
WBC: 7.8 10*3/uL (ref 4.0–10.5)

## 2013-09-01 LAB — BASIC METABOLIC PANEL
CO2: 35 mEq/L — ABNORMAL HIGH (ref 19–32)
Calcium: 8.4 mg/dL (ref 8.4–10.5)
Chloride: 95 mEq/L — ABNORMAL LOW (ref 96–112)
Creatinine, Ser: 0.39 mg/dL — ABNORMAL LOW (ref 0.50–1.10)
GFR calc Af Amer: >90 mL/min (ref >90)
GFR calc non Af Amer: >90 mL/min (ref >90)
Sodium: 138 mEq/L (ref 135–145)

## 2013-09-01 MED ORDER — POTASSIUM CHLORIDE 10 MEQ/100ML IV SOLN
10.0000 meq | INTRAVENOUS | Status: AC
  Administered 2013-09-01 (×6): 10 meq via INTRAVENOUS
  Filled 2013-09-01 (×6): qty 100

## 2013-09-01 NOTE — Progress Notes (Signed)
PULMONARY  / CRITICAL CARE MEDICINE  Name: Jenica Costilow MRN: [id number] DOB: [birth year]    ADMISSION DATE:  08/26/2013   REFERRING MD :  EDP PRIMARY SERVICE: PCCM  CHIEF COMPLAINT:   Abdominal pain and found to have Intra-abdominal viscus perforation on CT scan  BRIEF PATIENT DESCRIPTION:  Free intraperitoneal air on CT abdomen performed @ [facility] ED. 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 Transferred to [facility] ED.  Patient was DNR and under Hospice Care since July but DNR than Hospice Care was reversed temporarily for emergent abdominal surgical intervention.  SIGNIFICANT EVENTS / STUDIES:  11/25: CT abd (@ [facility]): free intra-abdominal gas with apparent extravasation of enteral contrast from greater stomach curvature 11/25: LapEx to open abdomen and correction of greater curvature of gastrocolic area viscus perforation  Intubated for surgery support with predicted difficulty weaning given end stage IPF 11/26:  Hemoglobin level drop from 10.8 to 8.1, repeat level ruled out lab error 11/27:  Hemoglobin continue to drop and it is 7.4 today, no apparent source of bleeding beside abdomen drainage in JP and bandages  LINES / TUBES: ET Tube 11/25 >>>11/30 Abdomen JP 11/25 >>> Gastric Tube 11/25 >>>  CULTURES: 11/25 Abdomen Fluid Culture >>>  Few G(+) Rods 11/25 Abdomen Fluid Anearobic Culture >>> Few G(+) Rods  ANTIBIOTICS: Fluconazole 11/25 >>  Zosyn 11/25 >>>11/28 11/28 unasyn>>>  SUBJECTIVE:  C/o feeling tight in chest with breathing.  Not much cough or sputum.  Abdominal pain okay.  VITAL SIGNS: Temp:  [98 F (36.7 C)-98.6 F (37 C)] 98 F (36.7 C) (12/01 0800) Pulse Rate:  [69-107] 81 (12/01 0900) Resp:  [15-32] 32 (12/01 0900) BP: (127-177)/(61-120) 159/72 mmHg (12/01 0900) SpO2:  [98 %-100 %] 100 % (12/01 0900) FiO2 (%):  [40 %] 40 % (11/30 1000) Weight:  [120 lb 5.9 oz (54.6 kg)] 120 lb 5.9 oz (54.6 kg) (12/01 0400) 10 L [place]  INTAKE  / OUTPUT: Intake/Output     11/30 0701 - 12/01 0700 12/01 0701 - 12/02 0700   P.O. 30    I.V. (mL/kg) 675.3 (12.4)    NG/GT 70    IV Piggyback 800 400   Total Intake(mL/kg) 1575.3 (28.9) 400 (7.3)   Urine (mL/kg/hr) 4015 (3.1) 150 (1)   Drains 115 (0.1)    Total Output 4130 150   Net -2554.7 +250        Stool Occurrence 2 x      PHYSICAL EXAMINATION: General: No distress Neuro: normal strength HEENT: NG tube in place Cardiovascular: regular Lungs: b/l crackles, no wheeze Abdomen: wound dressing clean Ext: no edema   LABS: CBC Recent Labs     08/29/13  1030  08/30/13  0608  09/01/13  0336  WBC  7.6  7.4  7.8  HGB  8.3*  8.2*  9.5*  HCT  27.2*  27.5*  30.4*  PLT  126*  131*  145*   BMET Recent Labs     08/30/13  2001  08/31/13  0326  09/01/13  0336  NA  135  137  138  K  2.9*  3.4*  2.8*  CL  95*  97  95*  CO2  30  30  35*  BUN  9  9  5*  CREATININE  0.46*  0.45*  0.39*  GLUCOSE  104*  106*  130*    Electrolytes Recent Labs     08/30/13  2001  08/31/13  0326  09/01/13  0336  CALCIUM  8.3*  8.3*  8.4  MG   --   1.6   --     Glucose Recent Labs     08/31/13  1700  08/31/13  1739  08/31/13  2035  09/01/13  0045  09/01/13  0333  09/01/13  0821  GLUCAP  64*  132*  122*  118*  113*  109*    Imaging Dg Chest Port 1 View  08/31/2013   CLINICAL DATA:  Evaluate interstitial lung disease  EXAM: PORTABLE CHEST - 1 VIEW  COMPARISON:  08/29/2013; 08/28/2013; 08/26/2013  FINDINGS: Grossly unchanged borderline enlarged cardiac silhouette and mediastinal contours. Interval retraction of endotracheal tube with tip now approximately 3.1 cm above the carina. Otherwise, stable position of support apparatus.  Minimally improved inspiratory effort with persistent bibasilar predominant coarse heterogeneous airspace opacities, right greater than left. No definite evidence of edema. Trace bilateral effusions are suspected. Grossly unchanged bones.  IMPRESSION: 1.  Interval retraction of endotracheal tube with tip now approximately 3.1 cm above the carina. No pneumothorax. 2. Minimally improved aeration of the lungs with persistent diffuse basilar predominant coarse reticular heterogeneous opacities, findings compatible with provided history of pulmonary fibrosis, though note, underlying pulmonary edema and/or infection is not excluded.   Electronically Signed   By: Simonne Come M.D.   On: 08/31/2013 08:20       ASSESSMENT / PLAN:  PULMONARY A:  Acute on Chronic respiratory failure with severe baseline hypoxemia due to IPF.  On chronic immunosuppression as outpt (cellcept, prednisone).  P:   -f/u CXR intermittently -bronchial hygiene -oxygen to keep SpO2 > 88% -hold cellcept for now in setting of peritonitis -continue BD's  CARDIOVASCULAR A: Sinus tachycardia, likely reactive >> improved  P:  - Control underlying causes - pain, volume status  RENAL A:   Hyponatremia - Resolved Hyperkalemia - Resolved Hypokalemia Hypervolemia  P:   -even to negative fluid balance -f/u and replace electrolytes as needed -keep foley in for now  GASTROINTESTINAL A:  Perforated gastric ulcer s/p laparotomy  P:   -continue BID protonix -advance diet per CCS -NG tube, wound care per CCS  HEMATOLOGIC A:   Acute on Chronic Anemia Thrombocytopenia mild Leukocytosis - Resolved  P:  -f/u CBC -lovenox for DVT prevention  INFECTIOUS A:   Peritonitis 2nd to perforated gastric ulcer.  P:   -Day 8/8 Abx, currently on unasyn >> d/c after dose on 12/01 -Day 7/7 diflucan >> d/c after dose on 12/01  ENDOCRINE A:  Relative adrenal insufficiency. Hyperglycemia.  P:   -stress dose steroids >> transition back to prednisone when able to take oral medications -SSI  NEUROLOGIC A:  Post op pain control. Deconditioning. P:   -Fentanyl prn -mobilize as tolerated -PT assessment  Goals of care >> DNR/DNI  Summary: Respiratory status stable  after extubation.  Mobilize as tolerated.  Will change to SDU status.  Coralyn Helling, MD [facility] Pulmonary/Critical Care 09/01/2013, 9:58 AM Pager:  [phone] After 3pm call: [phone]

## 2013-09-01 NOTE — Progress Notes (Signed)
eLink Physician-Brief Progress Note Patient Name: Tanya Horne DOB: 1950-11-14 MRN: [id number]  Date of Service  09/01/2013   HPI/Events of Note  Hypokalemia   eICU Interventions  Potassium replaced   Intervention Category Intermediate Interventions: Electrolyte abnormality - evaluation and management  [name] 09/01/2013, 5:12 AM

## 2013-09-01 NOTE — Progress Notes (Signed)
NUTRITION FOLLOW UP  Intervention:   - Diet advancement per MD - Recommend initiation of enteral nutrition as appropriate/per pt wishes - RD to continue to monitor   Nutrition Dx:   Inadequate oral intake related to inability to eat as evidenced by NPO, mechanical ventilation - ongoing but now as evidenced by pt on clear liquid diet with NGT in place.    Goal:   Initiation of enteral nutrition with goal to meet >90% of estimated nutritional needs - not met, pt not on TF  New goal: Advance diet as tolerated to regular diet   Monitor:   Weights, labs, diet advancement, NGT output, TF  Assessment:   Admitted from [facility] with c/o of abdominal pain. Found to have pneumoperitoneum with extravasation along the greater curvature on CT of abdomen per MD notes. Has been on hospice since July 2013 for end stage pulmonary fibrosis. S/p exploratory laparotomy, closure perforated gastric ulcer, omental patch, abdominal wound closure with retention sutures 11/25.   11/26 - Pt able to shake head during visit. Was able to gather she lost 10 pounds unintentionally in the past 6 months and her appetite was "so-so" PTA, then pt started to fall asleep.   11/29 - Trickle TF started via NGT of Vital AF 1.2 at 58ml/hr  11/30 - Plan was for possible panda tube placement 11/30. Pt extubated, TF stopped. NGT clamped.   12/1 - Getting sham feeding - clear liquid diet with NGT in place.   Potassium low, getting IV replacement.   Height: Ht Readings from Last 1 Encounters:  08/26/13 4\' 11"  (1.499 m)    Weight Status:   Wt Readings from Last 1 Encounters:  09/01/13 120 lb 5.9 oz (54.6 kg)  Admit wt:        133 lb 9.6 oz (60.6 kg)  Net I/Os: +648ml   Re-estimated needs:  Kcal: 1350-1500 Protein: 80-95g Fluid: 1.2-1.4L/day  Skin: +1 generalized edema, Stage 2 sacral pressure ulcer, abdominal incision  Diet Order: NPO   Intake/Output Summary (Last 24 hours) at 09/01/13  1438 Last data filed at 09/01/13 1300  Gross per 24 hour  Intake 1848.33 ml  Output   2690 ml  Net -841.67 ml    Last BM: 11/30   Labs:   Recent Labs Lab 08/26/13 0505 08/27/13 0315  08/30/13 2001 08/31/13 0326 09/01/13 0336  NA 129* 134*  < > 135 137 138  K 4.1 5.2*  < > 2.9* 3.4* 2.8*  CL 94* 103  < > 95* 97 95*  CO2 23 23  < > 30 30 35*  BUN 23 26*  < > 9 9 5*  CREATININE 1.01 0.81  < > 0.46* 0.45* 0.39*  CALCIUM 8.8 8.5  < > 8.3* 8.3* 8.4  MG  --  2.1  --   --  1.6  --   PHOS  --  4.3  --   --   --   --   GLUCOSE 103* 91  < > 104* 106* 130*  < > = values in this interval not displayed.  CBG (last 3)   Recent Labs  09/01/13 0333 09/01/13 0821 09/01/13 1143  GLUCAP 113* 109* 117*    Scheduled Meds: . albuterol  2.5 mg Nebulization Q6H  . ampicillin-sulbactam (UNASYN) IV  1.5 g Intravenous Q6H  . enoxaparin (LOVENOX) injection  40 mg Subcutaneous Q24H  . fluconazole (DIFLUCAN) IV  400 mg Intravenous QAC breakfast  . hydrocortisone sod succinate (SOLU-CORTEF)  inj  25 mg Intravenous Q12H  . insulin aspart  0-15 Units Subcutaneous Q4H  . pantoprazole (PROTONIX) IV  40 mg Intravenous Q12H    Continuous Infusions: . dextrose 50 mL/hr at 09/01/13 0606    Levon Hedger MS, RD, LDN [phone] Pager [phone] After Hours Pager

## 2013-09-01 NOTE — Progress Notes (Signed)
CARE MANAGEMENT NOTE 09/01/2013  Patient:  Tanya Horne, Tanya Horne   Account Number:  [id number]  Date Initiated:  08/26/2013  Documentation initiated by:  DAVIS,RHONDA  Subjective/Objective Assessment:   pt with end stage pulmonary fibrosis and now with gastric perforation requiring surgical intervention.  On vent for full resp support s/p surg.     Action/Plan:   patient is a patient of [facility]. RN is Renea Ee at [phone] or the main office at [phone]   Anticipated DC Date:  09/04/2013   Anticipated DC Plan:  HOME W HOSPICE CARE  In-house referral  Clinical Social Worker      DC Planning Services  CM consult      PAC Choice  HOSPICE   Choice offered to / List presented to:  NA   DME arranged  NA      DME agency  [facility]     HH arranged  NA      HH agency  [facility] [place]   Status of service:  In process, will continue to follow Medicare Important Message given?  NA - LOS <3 / Initial given by admissions (If response is "NO", the following Medicare IM given date fields will be blank) Date Medicare IM given:   Date Additional Medicare IM given:    Discharge Disposition:    Per UR Regulation:  Reviewed for med. necessity/level of care/duration of stay  If discussed at Long Length of Stay Meetings, dates discussed:    Comments:  [id number]/Rhonda Stark Jock, BSN, [place] [id number] Chart Reviewed for discharge and hospital needs. Discharge needs at time of review:  None present will follow for needs. Patient extubated on 11302014/ on 10 liter o2 via [place]. Review of patient progress due on [id number].   [id number] Earlene Plater RN, BSN, [place] [phone] Chart Reviewed for discharge and hospital needs. Discharge needs at time of review:  None present will follow for needs. pod#3 and vent day#3 Review of patient progress due on [id number].   [id number] Earlene Plater, RN, BSN, CCN: [phone] Case management. Chart  reviewed for discharge planning and present needs. Discharge needs: none present at time of review.  PLEASE SEE ABOVE NOTE FOR HOSPICE CONTACTS. Next chart review due:  [id number]

## 2013-09-01 NOTE — Progress Notes (Signed)
Patient ID: Tanya Horne, female   DOB: 1950/10/20, 62 y.o.   MRN: [id number] [facility] Progress Note:   6 Days Post-Op  Subjective: Mental status is fairly clear Objective: Vital signs in last 24 hours: Temp:  [98 F (36.7 C)-98.6 F (37 C)] 98 F (36.7 C) (12/01 0800) Pulse Rate:  [69-107] 77 (12/01 1000) Resp:  [15-39] 39 (12/01 1000) BP: (127-177)/(61-120) 161/72 mmHg (12/01 1000) SpO2:  [98 %-100 %] 100 % (12/01 1000) Weight:  [120 lb 5.9 oz (54.6 kg)] 120 lb 5.9 oz (54.6 kg) (12/01 0400)  Intake/Output from previous day: 11/30 0701 - 12/01 0700 In: 1575.3 [P.O.:30; I.V.:675.3; NG/GT:70; IV Piggyback:800] Out: 4130 [Urine:4015; Drains:115] Intake/Output this shift: Total I/O In: 550 [I.V.:50; IV Piggyback:500] Out: 150 [Urine:150]  Physical Exam: Work of breathing is not labored considering extubated and underlying pulmonary fibrosis.  Wound approximated with retention sutures.    Lab Results:  Results for orders placed during the hospital encounter of 08/26/13 (from the past 48 hour(s))  GLUCOSE, CAPILLARY     Status: None   Collection Time    08/30/13 12:23 PM      Result Value Range   Glucose-Capillary 96  70 - 99 mg/dL  GLUCOSE, CAPILLARY     Status: None   Collection Time    08/30/13  3:41 PM      Result Value Range   Glucose-Capillary 77  70 - 99 mg/dL   Comment 1 Documented in Chart     Comment 2 Notify RN    BASIC METABOLIC PANEL     Status: Abnormal   Collection Time    08/30/13  8:01 PM      Result Value Range   Sodium 135  135 - 145 mEq/L   Potassium 2.9 (*) 3.5 - 5.1 mEq/L   Chloride 95 (*) 96 - 112 mEq/L   CO2 30  19 - 32 mEq/L   Glucose, Bld 104 (*) 70 - 99 mg/dL   BUN 9  6 - 23 mg/dL   Creatinine, Ser 7.82 (*) 0.50 - 1.10 mg/dL   Calcium 8.3 (*) 8.4 - 10.5 mg/dL   GFR calc non Af Amer >90  >90 mL/min   GFR calc Af Amer >90  >90 mL/min   Comment: (NOTE)     The eGFR has been calculated using the CKD EPI equation.     This  calculation has not been validated in all clinical situations.     eGFR's persistently <90 mL/min signify possible Chronic Kidney     Disease.  GLUCOSE, CAPILLARY     Status: Abnormal   Collection Time    08/30/13  8:20 PM      Result Value Range   Glucose-Capillary 109 (*) 70 - 99 mg/dL  GLUCOSE, CAPILLARY     Status: Abnormal   Collection Time    08/31/13 12:13 AM      Result Value Range   Glucose-Capillary 114 (*) 70 - 99 mg/dL   Comment 1 Notify RN    BASIC METABOLIC PANEL     Status: Abnormal   Collection Time    08/31/13  3:26 AM      Result Value Range   Sodium 137  135 - 145 mEq/L   Potassium 3.4 (*) 3.5 - 5.1 mEq/L   Chloride 97  96 - 112 mEq/L   CO2 30  19 - 32 mEq/L   Glucose, Bld 106 (*) 70 - 99 mg/dL   BUN 9  6 - 23 mg/dL   Creatinine, Ser 4.09 (*) 0.50 - 1.10 mg/dL   Calcium 8.3 (*) 8.4 - 10.5 mg/dL   GFR calc non Af Amer >90  >90 mL/min   GFR calc Af Amer >90  >90 mL/min   Comment: (NOTE)     The eGFR has been calculated using the CKD EPI equation.     This calculation has not been validated in all clinical situations.     eGFR's persistently <90 mL/min signify possible Chronic Kidney     Disease.  MAGNESIUM     Status: None   Collection Time    08/31/13  3:26 AM      Result Value Range   Magnesium 1.6  1.5 - 2.5 mg/dL  GLUCOSE, CAPILLARY     Status: None   Collection Time    08/31/13  3:31 AM      Result Value Range   Glucose-Capillary 99  70 - 99 mg/dL   Comment 1 Notify RN    GLUCOSE, CAPILLARY     Status: Abnormal   Collection Time    08/31/13  7:37 AM      Result Value Range   Glucose-Capillary 100 (*) 70 - 99 mg/dL   Comment 1 Documented in Chart     Comment 2 Notify RN    GLUCOSE, CAPILLARY     Status: Abnormal   Collection Time    08/31/13 12:06 PM      Result Value Range   Glucose-Capillary 104 (*) 70 - 99 mg/dL  GLUCOSE, CAPILLARY     Status: Abnormal   Collection Time    08/31/13  5:00 PM      Result Value Range   Glucose-Capillary  64 (*) 70 - 99 mg/dL  GLUCOSE, CAPILLARY     Status: Abnormal   Collection Time    08/31/13  5:39 PM      Result Value Range   Glucose-Capillary 132 (*) 70 - 99 mg/dL  GLUCOSE, CAPILLARY     Status: Abnormal   Collection Time    08/31/13  8:35 PM      Result Value Range   Glucose-Capillary 122 (*) 70 - 99 mg/dL   Comment 1 Notify RN    GLUCOSE, CAPILLARY     Status: Abnormal   Collection Time    09/01/13 12:45 AM      Result Value Range   Glucose-Capillary 118 (*) 70 - 99 mg/dL   Comment 1 Documented in Chart     Comment 2 Notify RN    GLUCOSE, CAPILLARY     Status: Abnormal   Collection Time    09/01/13  3:33 AM      Result Value Range   Glucose-Capillary 113 (*) 70 - 99 mg/dL   Comment 1 Documented in Chart     Comment 2 Notify RN    BASIC METABOLIC PANEL     Status: Abnormal   Collection Time    09/01/13  3:36 AM      Result Value Range   Sodium 138  135 - 145 mEq/L   Potassium 2.8 (*) 3.5 - 5.1 mEq/L   Comment: DELTA CHECK NOTED     REPEATED TO VERIFY   Chloride 95 (*) 96 - 112 mEq/L   CO2 35 (*) 19 - 32 mEq/L   Glucose, Bld 130 (*) 70 - 99 mg/dL   BUN 5 (*) 6 - 23 mg/dL   Creatinine, Ser 8.11 (*) 0.50 - 1.10 mg/dL  Calcium 8.4  8.4 - 10.5 mg/dL   GFR calc non Af Amer >90  >90 mL/min   GFR calc Af Amer >90  >90 mL/min   Comment: (NOTE)     The eGFR has been calculated using the CKD EPI equation.     This calculation has not been validated in all clinical situations.     eGFR's persistently <90 mL/min signify possible Chronic Kidney     Disease.  CBC     Status: Abnormal   Collection Time    09/01/13  3:36 AM      Result Value Range   WBC 7.8  4.0 - 10.5 K/uL   RBC 3.14 (*) 3.87 - 5.11 MIL/uL   Hemoglobin 9.5 (*) 12.0 - 15.0 g/dL   HCT 16.1 (*) 09.6 - 04.5 %   MCV 96.8  78.0 - 100.0 fL   MCH 30.3  26.0 - 34.0 pg   MCHC 31.3  30.0 - 36.0 g/dL   RDW 40.9 (*) 81.1 - 91.4 %   Platelets 145 (*) 150 - 400 K/uL  GLUCOSE, CAPILLARY     Status: Abnormal    Collection Time    09/01/13  8:21 AM      Result Value Range   Glucose-Capillary 109 (*) 70 - 99 mg/dL   Comment 1 Documented in Chart     Comment 2 Notify RN      Radiology/Results: Dg Chest Port 1 View  08/31/2013   CLINICAL DATA:  Evaluate interstitial lung disease  EXAM: PORTABLE CHEST - 1 VIEW  COMPARISON:  08/29/2013; 08/28/2013; 08/26/2013  FINDINGS: Grossly unchanged borderline enlarged cardiac silhouette and mediastinal contours. Interval retraction of endotracheal tube with tip now approximately 3.1 cm above the carina. Otherwise, stable position of support apparatus.  Minimally improved inspiratory effort with persistent bibasilar predominant coarse heterogeneous airspace opacities, right greater than left. No definite evidence of edema. Trace bilateral effusions are suspected. Grossly unchanged bones.  IMPRESSION: 1. Interval retraction of endotracheal tube with tip now approximately 3.1 cm above the carina. No pneumothorax. 2. Minimally improved aeration of the lungs with persistent diffuse basilar predominant coarse reticular heterogeneous opacities, findings compatible with provided history of pulmonary fibrosis, though note, underlying pulmonary edema and/or infection is not excluded.   Electronically Signed   By: Simonne Come M.D.   On: 08/31/2013 08:20    Anti-infectives: Anti-infectives   Start     Dose/Rate Route Frequency Ordered Stop   08/29/13 1200  ampicillin-sulbactam (UNASYN) 1.5 g in sodium chloride 0.9 % 50 mL IVPB     1.5 g 100 mL/hr over 30 Minutes Intravenous Every 6 hours 08/29/13 1002 09/02/13 1002   08/26/13 1400  piperacillin-tazobactam (ZOSYN) IVPB 3.375 g  Status:  Discontinued     3.375 g 12.5 mL/hr over 240 Minutes Intravenous 3 times per day 08/26/13 0556 08/29/13 0959   08/26/13 0730  fluconazole (DIFLUCAN) IVPB 400 mg     400 mg 100 mL/hr over 120 Minutes Intravenous Daily before breakfast 08/26/13 0705 09/02/13 1003   08/26/13 0615   piperacillin-tazobactam (ZOSYN) IVPB 3.375 g     3.375 g 100 mL/hr over 30 Minutes Intravenous  Once 08/26/13 0603 08/26/13 7829      Assessment/Plan: Problem List: Patient Active Problem List   Diagnosis Date Noted  . Acute-on-chronic respiratory failure 08/26/2013  . Pre-operative respiratory examination 08/26/2013  . Chronic use of steroids 08/26/2013  . Adrenal insufficiency 08/26/2013  . Pulmonary fibrosis 08/26/2013  . Severe hypoxemia 08/26/2013  .  Perforated gastric ulcer 08/26/2013    OK to drink clears with NG in place--sham feeding.  Improved.  6 Days Post-Op    LOS: 6 days   Matt B. Daphine Deutscher, MD, [facility] [phone] beeper [phone]  09/01/2013 12:01 PM

## 2013-09-01 NOTE — Progress Notes (Signed)
PT Cancellation Note  Patient Details Name: Patsie Mccardle MRN: [id number] DOB: 11-09-1950   Cancelled Treatment:    Reason Eval/Treat Not Completed: Patient declined, no reason specified Pt declines today however stated she would start tomorrow.  Son also reports he knows mobility is important and will encourage pt to participate tomorrow.   Naidelin Gugliotta,KATHrine E 09/01/2013, 11:33 AM Zenovia Jarred, PT, DPT 09/01/2013 Pager: [phone]

## 2013-09-02 ENCOUNTER — Inpatient Hospital Stay (HOSPITAL_COMMUNITY): Payer: Self-pay

## 2013-09-02 LAB — GLUCOSE, CAPILLARY
Glucose-Capillary: 112 mg/dL — ABNORMAL HIGH (ref 70–99)
Glucose-Capillary: 124 mg/dL — ABNORMAL HIGH (ref 70–99)
Glucose-Capillary: 99 mg/dL (ref 70–99)

## 2013-09-02 LAB — CBC
MCV: 98.1 fL (ref 78.0–100.0)
Platelets: 173 10*3/uL (ref 150–400)
RBC: 3.1 MIL/uL — ABNORMAL LOW (ref 3.87–5.11)
WBC: 7 10*3/uL (ref 4.0–10.5)

## 2013-09-02 LAB — BASIC METABOLIC PANEL
CO2: 35 mEq/L — ABNORMAL HIGH (ref 19–32)
Calcium: 8.5 mg/dL (ref 8.4–10.5)
Chloride: 96 mEq/L (ref 96–112)
Creatinine, Ser: 0.41 mg/dL — ABNORMAL LOW (ref 0.50–1.10)
Potassium: 3.2 mEq/L — ABNORMAL LOW (ref 3.5–5.1)
Sodium: 137 mEq/L (ref 135–145)

## 2013-09-02 MED ORDER — METHYLPREDNISOLONE SODIUM SUCC 40 MG IJ SOLR
10.0000 mg | Freq: Every day | INTRAMUSCULAR | Status: DC
  Administered 2013-09-02 – 2013-09-03 (×2): 10 mg via INTRAVENOUS
  Filled 2013-09-02 (×2): qty 0.25
  Filled 2013-09-02: qty 1

## 2013-09-02 MED ORDER — POTASSIUM CHLORIDE 10 MEQ/100ML IV SOLN
10.0000 meq | INTRAVENOUS | Status: AC
  Administered 2013-09-02 (×4): 10 meq via INTRAVENOUS
  Filled 2013-09-02: qty 100

## 2013-09-02 MED ORDER — KCL IN DEXTROSE-NACL 20-5-0.45 MEQ/L-%-% IV SOLN
INTRAVENOUS | Status: DC
  Administered 2013-09-02: 09:00:00 via INTRAVENOUS
  Administered 2013-09-03: 50 mL via INTRAVENOUS
  Administered 2013-09-03: 11:00:00 via INTRAVENOUS
  Filled 2013-09-02 (×3): qty 1000

## 2013-09-02 MED ORDER — IOHEXOL 300 MG/ML  SOLN
50.0000 mL | Freq: Once | INTRAMUSCULAR | Status: AC | PRN
  Administered 2013-09-02: 50 mL via ORAL

## 2013-09-02 MED ORDER — ALBUTEROL SULFATE (5 MG/ML) 0.5% IN NEBU
2.5000 mg | INHALATION_SOLUTION | Freq: Four times a day (QID) | RESPIRATORY_TRACT | Status: DC
  Administered 2013-09-02 – 2013-09-08 (×23): 2.5 mg via RESPIRATORY_TRACT
  Filled 2013-09-02 (×22): qty 0.5

## 2013-09-02 NOTE — Progress Notes (Signed)
Discussed prednisone and cellcept use with pts [facility] Pulmonologist - Dr. Dudley Major (via [facility] [place], FNP).  He recommends no further cellcept and when prednisone resumed to restart at 5mg  QD.     Canary Brim, NP-C [facility] Pgr: [phone] or [phone]  Above, noted.  Coralyn Helling, MD [facility] Pulmonary/Critical Care 09/02/2013, 10:09 AM Pager:  [phone] After 3pm call: [phone]

## 2013-09-02 NOTE — Progress Notes (Signed)
[facility] ADULT ICU REPLACEMENT PROTOCOL FOR AM LAB REPLACEMENT ONLY  The patient does apply for the [facility] Adult ICU Electrolyte Replacment Protocol based on the criteria listed below:   1. Is GFR >/= 40 ml/min? yes  Patient's GFR today is >90 2. Is urine output >/= 0.5 ml/kg/hr for the last 6 hours? yes Patient's UOP is 1.1 ml/kg/hr 3. Is BUN < 60 mg/dL? yes  Patient's BUN today is 4 4. Abnormal electrolyte(s):Potassium 5. Ordered repletion with: Potassium per Protocol  Tanya Horne P 09/02/2013 4:54 AM

## 2013-09-02 NOTE — Evaluation (Signed)
Physical Therapy Evaluation Patient Details Name: Tanya Horne MRN: [id number] DOB: [birth year] Today's Date: 09/02/2013 Time: 4098-1191 PT Time Calculation (min): 13 min  PT Assessment / Plan / Recommendation History of Present Illness  62 yo female transferred from [facility] with abdominal pain, pneumoperitoneum from perforated gastric ulcer.  She has hx of severe pulmonary fibrosis on chronic immunosuppression (followed at [facility]) and chronic hypoxic respiratory failure.  She was DNR and hospice care >> temporarily reversed for surgical intervention.  11/25 Exploratory laparotomy, closure perforated gastric ulcer, omental patch, abdominal wound closure with retention sutures; remained on vent post-op and extubated 11/30  Clinical Impression  Pt admitted with above.  Pt currently with functional limitations due to the deficits listed below (see PT Problem List).  Pt will benefit from skilled PT to increase their independence and safety with mobility to allow discharge to the venue listed below.  Pt independent at baseline however reports increased time and fatigue with mobility and ADLs.  Will see in acute care to increase mobility and assist pt in returning to her baseline.     PT Assessment  Patient needs continued PT services    Follow Up Recommendations  No PT follow up    Does the patient have the potential to tolerate intense rehabilitation      Barriers to Discharge        Equipment Recommendations  None recommended by PT    Recommendations for Other Services     Frequency Min 3X/week    Precautions / Restrictions Precautions Precautions: Fall Precaution Comments: chronic O2   Pertinent Vitals/Pain Pt reports incisional pain in abdomen after mobility, repositioned, pt did not wish for pain meds however aware to use call bell if she changes her mind. VSS - remained on O2 [place]     Mobility  Bed Mobility Bed Mobility: Rolling Right;Right Sidelying to Sit Rolling Right: 3:  Mod assist Right Sidelying to Sit: 4: Min guard Details for Bed Mobility Assistance: log roll technique due to abdominal sx Transfers Transfers: Sit to Stand;Stand to Sit;Stand Pivot Transfers Sit to Stand: 4: Min guard;From bed;With upper extremity assist Stand to Sit: 4: Min guard;To chair/3-in-1;With armrests;With upper extremity assist Stand Pivot Transfers: 4: Min guard Details for Transfer Assistance: one HHA over to recliner, pt declined RW today as she states she doesnt typically use one, pt with increased abdominal pain upon sitting at incision site, encouraged use of hugging pillow Ambulation/Gait Ambulation/Gait Assistance: Not tested (comment)    Exercises     PT Diagnosis: Difficulty walking;Acute pain  PT Problem List: Decreased activity tolerance;Decreased mobility;Pain PT Treatment Interventions: DME instruction;Gait training;Stair training;Functional mobility training;Therapeutic activities;Therapeutic exercise;Patient/family education     PT Goals(Current goals can be found in the care plan section) Acute Rehab PT Goals Patient Stated Goal: son would like pt to return to [facility] and return to his home PT Goal Formulation: With patient Time For Goal Achievement: 09/16/13 Potential to Achieve Goals: Fair  Visit Information  Last PT Received On: 09/02/13 Assistance Needed: +1 History of Present Illness: 62 yo female transferred from [facility] with abdominal pain, pneumoperitoneum from perforated gastric ulcer.  She has hx of severe pulmonary fibrosis on chronic immunosuppression (followed at [facility]) and chronic hypoxic respiratory failure.  She was DNR and hospice care >> temporarily reversed for surgical intervention.  11/25 Exploratory laparotomy, closure perforated gastric ulcer, omental patch, abdominal wound closure with retention sutures; remained on vent post-op and extubated 11/30       Prior Functioning  Home Living Family/patient expects to be discharged to:: Private  residence Living Arrangements: Children (son) Type of Home: House Home Access: Stairs to enter [employer] of Steps: 2-3 Home Layout: One level Home Equipment: Environmental consultant - 2 wheels;Wheelchair - manual Prior Function Level of Independence: Independent Comments: pt reports independent at home however fatigues easily and becomes SOB quickly, usually on 10L O2, increased time for ADLs however able to perform Communication Communication: No difficulties    Cognition  Cognition Arousal/Alertness: Awake/alert Behavior During Therapy: Flat affect Overall Cognitive Status: Within Functional Limits for tasks assessed    Extremity/Trunk Assessment Lower Extremity Assessment Lower Extremity Assessment: Overall WFL for tasks assessed   Balance    End of Session PT - End of Session Equipment Utilized During Treatment: Oxygen Activity Tolerance: Patient limited by pain Patient left: with call bell/phone within reach;in chair  GP     Jamal Haskin,KATHrine E 09/02/2013, 1:03 PM Zenovia Jarred, PT, DPT 09/02/2013 Pager: [phone]

## 2013-09-02 NOTE — Progress Notes (Signed)
7 Days Post-Op  Subjective: Extubated up in chair, says her stomach is sore.  Objective: Vital signs in last 24 hours: Temp:  [97.7 F (36.5 C)-98.5 F (36.9 C)] 98 F (36.7 C) (12/02 0809) Pulse Rate:  [70-106] 78 (12/02 0900) Resp:  [18-48] 19 (12/02 0900) BP: (132-164)/(62-89) 153/85 mmHg (12/02 0800) SpO2:  [97 %-100 %] 100 % (12/02 0900) Weight:  [54.2 kg (119 lb 7.8 oz)] 54.2 kg (119 lb 7.8 oz) (12/02 0400) Last BM Date: 09/01/13 NPO Ng in nothing recorded. Afebrile VSS K+ low other wise stable Intake/Output from previous day: 12/01 0701 - 12/02 0700 In: 1985 [P.O.:60; I.V.:975; IV Piggyback:950] Out: 2810 [Urine:2725; Drains:85] Intake/Output this shift: Total I/O In: 200 [IV Piggyback:200] Out: -   General appearance: alert, cooperative, no distress and EXTUBATED GI: SOFT SORE, +BS, not distended, Open wound with retenttion sutures looks good.  Lab Results:   Recent Labs  09/01/13 0336 09/02/13 0352  WBC 7.8 7.0  HGB 9.5* 9.1*  HCT 30.4* 30.4*  PLT 145* 173    BMET  Recent Labs  09/01/13 0336 09/02/13 0352  NA 138 137  K 2.8* 3.2*  CL 95* 96  CO2 35* 35*  GLUCOSE 130* 112*  BUN 5* 4*  CREATININE 0.39* 0.41*  CALCIUM 8.4 8.5   PT/INR No results found for this basename: LABPROT, INR,  in the last 72 hours  No results found for this basename: AST, ALT, ALKPHOS, BILITOT, PROT, ALBUMIN,  in the last 168 hours   Lipase     Component Value Date/Time   LIPASE 46 08/26/2013 0505     Studies/Results: No results found.  Medications: . albuterol  2.5 mg Nebulization Q6H WA  . enoxaparin (LOVENOX) injection  40 mg Subcutaneous Q24H  . insulin aspart  0-15 Units Subcutaneous Q4H  . methylPREDNISolone (SOLU-MEDROL) injection  10 mg Intravenous Daily  . pantoprazole (PROTONIX) IV  40 mg Intravenous Q12H  . potassium chloride  10 mEq Intravenous Q1 Hr x 4   . 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

## 2013-09-02 NOTE — Progress Notes (Signed)
PULMONARY  / CRITICAL CARE MEDICINE  Name: Tanya Horne MRN: [id number] DOB: July 09, 1951    ADMISSION DATE:  08/26/2013   REFERRING MD :  EDP PRIMARY SERVICE: PCCM  CHIEF COMPLAINT:   Abdominal pain and found to have Intra-abdominal viscus perforation on CT scan  BRIEF PATIENT DESCRIPTION:  62 yo female transferred from [facility] with abdominal pain, pneumoperitoneum from perforated gastric ulcer.  She has hx of severe pulmonary fibrosis on chronic immunosuppression (followed at [facility]) and chronic hypoxic respiratory failure.  She was DNR and hospice care >> temporarily reversed for surgical intervention.  SIGNIFICANT EVENTS: 11/25 Exploratory laparotomy, closure perforated gastric ulcer, omental patch, abdominal wound closure with retention sutures; remained on vent post-op 11/26 Hb drop from 10.8 to 8.1 11/27 Hb drop to 7.4  STUDIES:  11/25 CT abd (@ [facility]) >> free intra-abdominal gas with apparent extravasation of enteral contrast from greater stomach curvature  LINES / TUBES: ET Tube 11/25 >>>11/30 Abdomen JP 11/25 >>> Gastric Tube 11/25 >>>  CULTURES: 11/25 Abdomen Fluid Culture >>>  Few G(+) Rods 11/25 Abdomen Fluid Anearobic Culture >>> Few G(+) Rods  ANTIBIOTICS: Fluconazole 11/25 >>  Zosyn 11/25 >>>11/28 11/28 unasyn>>>  SUBJECTIVE:  Intermittent cough.  Abd pain improving.  Slept okay overnight.  VITAL SIGNS: Temp:  [97.7 F (36.5 C)-98.5 F (36.9 C)] 97.7 F (36.5 C) (12/02 0400) Pulse Rate:  [70-106] 85 (12/02 0514) Resp:  [18-39] 38 (12/02 0514) BP: (132-164)/(62-89) 148/70 mmHg (12/02 0514) SpO2:  [97 %-100 %] 100 % (12/02 0514) Weight:  [119 lb 7.8 oz (54.2 kg)] 119 lb 7.8 oz (54.2 kg) (12/02 0400) 4 L [place]  INTAKE / OUTPUT: Intake/Output     12/01 0701 - 12/02 0700 12/02 0701 - 12/03 0700   P.O. 60    I.V. (mL/kg) 975 (18)    NG/GT     IV Piggyback 950    Total Intake(mL/kg) 1985 (36.6)    Urine (mL/kg/hr) 2725 (2.1)    Drains 85 (0.1)    Total  Output 2810     Net -825          Stool Occurrence 2 x      PHYSICAL EXAMINATION: General: No distress Neuro: normal strength HEENT: NG tube in place Cardiovascular: regular Lungs: b/l crackles, no wheeze Abdomen: wound dressing clean Ext: no edema   LABS: CBC Recent Labs     09/01/13  0336  09/02/13  0352  WBC  7.8  7.0  HGB  9.5*  9.1*  HCT  30.4*  30.4*  PLT  145*  173   BMET Recent Labs     08/31/13  0326  09/01/13  0336  09/02/13  0352  NA  137  138  137  K  3.4*  2.8*  3.2*  CL  97  95*  96  CO2  30  35*  35*  BUN  9  5*  4*  CREATININE  0.45*  0.39*  0.41*  GLUCOSE  106*  130*  112*    Electrolytes Recent Labs     08/31/13  0326  09/01/13  0336  09/02/13  0352  CALCIUM  8.3*  8.4  8.5  MG  1.6   --    --     Glucose Recent Labs     09/01/13  0821  09/01/13  1143  09/01/13  1631  09/01/13  1918  09/01/13  2340  09/02/13  0303  GLUCAP  109*  117*  100*  103*  124*  99    Imaging No results found.   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  On chronic immunosuppression as outpt (cellcept, prednisone). P:   -f/u CXR intermittently -bronchial hygiene -oxygen to keep SpO2 > 88% -hold cellcept for now in setting of peritonitis >> will need to determine indication for cellcept, and then determine if this should be resumed -continue BD's  CARDIOVASCULAR A:  Sinus tachycardia, likely reactive >> improved. P:  -monitor hemodynamics  RENAL A:   Hyponatremia, Hyperkalemia - Resolved. Hypokalemia. Hypervolemia. P:   -even to negative fluid balance -f/u and replace electrolytes as needed -keep foley in for now  GASTROINTESTINAL A:  Perforated gastric ulcer s/p laparotomy. P:   -continue BID protonix -advance diet per [facility] -NG tube, wound care per [facility]  HEMATOLOGIC A:   Acute on Chronic Anemia. Thrombocytopenia - improved. Leukocytosis -  Resolved. P:  -f/u CBC -lovenox for DVT prevention  INFECTIOUS A:   Peritonitis 2nd to perforated gastric ulcer - completed Abx 12/01. P:   -monitor off abx  ENDOCRINE A:  Relative adrenal insufficiency. Hyperglycemia. P:   -change to solumedrol 10 mg daily on 12/02 -SSI  NEUROLOGIC A:  Post op pain control. Deconditioning. P:   -Fentanyl prn -mobilize as tolerated -PT assessment  Goals of care >> DNR/DNI  Summary: Respiratory status stable after extubation.  Mobilize as tolerated.  Continue SDU status for now. She is followed by Dr. Baldo Ash at [facility] for pulmonary fibrosis >> will need to speak with Dr. Dudley Major to determine if she should resume cellcept, prednisone.  Coralyn Helling, MD [facility] Pulmonary/Critical Care 09/02/2013, 8:00 AM Pager:  [phone] After 3pm call: [phone]

## 2013-09-03 LAB — MAGNESIUM: Magnesium: 1.9 mg/dL (ref 1.5–2.5)

## 2013-09-03 LAB — BASIC METABOLIC PANEL
BUN: 4 mg/dL — ABNORMAL LOW (ref 6–23)
CO2: 35 mEq/L — ABNORMAL HIGH (ref 19–32)
Calcium: 8.8 mg/dL (ref 8.4–10.5)
Chloride: 101 mEq/L (ref 96–112)
Glucose, Bld: 103 mg/dL — ABNORMAL HIGH (ref 70–99)
Potassium: 3.1 mEq/L — ABNORMAL LOW (ref 3.5–5.1)
Sodium: 141 mEq/L (ref 135–145)

## 2013-09-03 LAB — GLUCOSE, CAPILLARY
Glucose-Capillary: 100 mg/dL — ABNORMAL HIGH (ref 70–99)
Glucose-Capillary: 98 mg/dL (ref 70–99)
Glucose-Capillary: 95 mg/dL (ref 70–99)
Glucose-Capillary: 101 mg/dL — ABNORMAL HIGH (ref 70–99)
Glucose-Capillary: 120 mg/dL — ABNORMAL HIGH (ref 70–99)

## 2013-09-03 MED ORDER — POTASSIUM CHLORIDE 10 MEQ/100ML IV SOLN
10.0000 meq | INTRAVENOUS | Status: DC
  Administered 2013-09-03 (×2): 10 meq via INTRAVENOUS
  Filled 2013-09-03 (×3): qty 100

## 2013-09-03 MED ORDER — POTASSIUM CHLORIDE 10 MEQ/100ML IV SOLN
10.0000 meq | INTRAVENOUS | Status: AC
  Administered 2013-09-03 (×4): 10 meq via INTRAVENOUS
  Filled 2013-09-03 (×6): qty 100

## 2013-09-03 MED ORDER — BIOTENE DRY MOUTH MT LIQD
15.0000 mL | Freq: Four times a day (QID) | OROMUCOSAL | Status: DC
  Administered 2013-09-03 – 2013-09-06 (×8): 15 mL via OROMUCOSAL

## 2013-09-03 MED ORDER — CHLORHEXIDINE GLUCONATE 0.12 % MT SOLN
15.0000 mL | Freq: Two times a day (BID) | OROMUCOSAL | Status: DC
  Administered 2013-09-03 – 2013-09-07 (×8): 15 mL via OROMUCOSAL
  Filled 2013-09-03 (×13): qty 15

## 2013-09-03 MED ORDER — CHLORHEXIDINE GLUCONATE 0.12 % MT SOLN
OROMUCOSAL | Status: AC
  Filled 2013-09-03: qty 15

## 2013-09-03 NOTE — Progress Notes (Signed)
PULMONARY  / CRITICAL CARE MEDICINE  Name: Tanya Horne MRN: [id number] DOB: 1951-08-15    ADMISSION DATE:  08/26/2013   REFERRING MD :  EDP PRIMARY SERVICE: PCCM  CHIEF COMPLAINT:   Abdominal pain and found to have Intra-abdominal viscus perforation on CT scan  BRIEF PATIENT DESCRIPTION:  62 yo female transferred from [facility] with abdominal pain, pneumoperitoneum from perforated gastric ulcer.  She has hx of severe pulmonary fibrosis on chronic immunosuppression (followed at [facility]) and chronic hypoxic respiratory failure.  She was DNR and hospice care >> temporarily reversed for surgical intervention.  SIGNIFICANT EVENTS: 11/25 Exploratory laparotomy, closure perforated gastric ulcer, omental patch, abdominal wound closure with retention sutures; remained on vent post-op 11/26 Hb drop from 10.8 to 8.1 11/27 Hb drop to 7.4 12/02 Spoke with [facility] >> plan to change prednisone to 5 mg daily, and not resume cellcept  STUDIES:  11/25 CT abd (@ [facility]) >> free intra-abdominal gas with apparent extravasation of enteral contrast from greater stomach curvature  LINES / TUBES: ET Tube 11/25 >>>11/30  CULTURES: 11/25 Abdomen Fluid Culture >>>  Few G(+) Rods 11/25 Abdomen Fluid Anearobic Culture >>> Few G(+) Rods  ANTIBIOTICS: Fluconazole 11/25 >>12/01 Zosyn 11/25 >>>11/28 11/28 unasyn>>>12/01  SUBJECTIVE:  Feels better.  Worked with PT yesterday.  Decreased cough.  VITAL SIGNS: Temp:  [98.2 F (36.8 C)-98.8 F (37.1 C)] 98.4 F (36.9 C) (12/03 0400) Pulse Rate:  [80-122] 80 (12/03 0600) Resp:  [20-48] 26 (12/03 0400) BP: (125-160)/(64-119) 139/72 mmHg (12/03 0600) SpO2:  [98 %-100 %] 100 % (12/03 0600) Weight:  [124 lb 1.9 oz (56.3 kg)] 124 lb 1.9 oz (56.3 kg) (12/03 0500) 4 L [place]  INTAKE / OUTPUT: Intake/Output     12/02 0701 - 12/03 0700 12/03 0701 - 12/04 0700   P.O.     I.V. (mL/kg) 1033.3 (18.4)    IV Piggyback 400    Total Intake(mL/kg) 1433.3 (25.5)     Urine (mL/kg/hr) 1775 (1.3)    Drains 70 (0.1)    Total Output 1845     Net -411.7          Stool Occurrence 5 x      PHYSICAL EXAMINATION: General: No distress Neuro: normal strength HEENT: no sinus tenderness Cardiovascular: regular Lungs: b/l crackles, no wheeze Abdomen: wound dressing clean Ext: no edema   LABS: CBC Recent Labs     09/01/13  0336  09/02/13  0352  WBC  7.8  7.0  HGB  9.5*  9.1*  HCT  30.4*  30.4*  PLT  145*  173   BMET Recent Labs     09/01/13  0336  09/02/13  0352  09/03/13  0330  NA  138  137  141  K  2.8*  3.2*  3.1*  CL  95*  96  101  CO2  35*  35*  35*  BUN  5*  4*  4*  CREATININE  0.39*  0.41*  0.48*  GLUCOSE  130*  112*  103*    Electrolytes Recent Labs     09/01/13  0336  09/02/13  0352  09/03/13  0330  CALCIUM  8.4  8.5  8.8  MG   --    --   1.9    Glucose Recent Labs     09/02/13  1145  09/02/13  1617  09/02/13  1939  09/02/13  2338  09/03/13  0339  09/03/13  0811  GLUCAP  112*  112*  101*  100*  98  95    Imaging Dg Ugi W/water Sol Cm  09/02/2013   CLINICAL DATA:  Recent perforated gastric ulcer.  EXAM: WATER SOLUBLE UPPER GI SERIES  TECHNIQUE: Single-column upper GI series was performed using water soluble contrast.  CONTRAST:  50mL OMNIPAQUE IOHEXOL 300 MG/ML  SOLN  COMPARISON:  None.  FLUOROSCOPY TIME:  1 min 56 seconds  FINDINGS: Contrast was initially taken by mouth. Contrast was then injected through the indwelling NG tube. There is no evidence of extravasation of contrast from the esophagus or stomach or duodenal bulb or C-loop.  There is an irregularity of the distal antrum which is probably the site of the previous perforated gastric ulcer. The mucosa in the stomach is slightly prominent. Duodenal bulb and C-loop appear normal.  IMPRESSION: No evidence of extravasated contrast from the stomach. There is irregularity in the distal antrum which is the presumed site of gastric ulcer.   Electronically Signed   By:  Geanie Cooley M.D.   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  On chronic immunosuppression as outpt (cellcept, prednisone). P:   -f/u CXR intermittently -bronchial hygiene -oxygen to keep SpO2 > 88% -continue solumedrol IV for now >> plan to resume prednisone 5 mg daily -will not resume cellcept until re-evaluated as outpt with [facility] pulmonary -continue BD's  CARDIOVASCULAR A:  Sinus tachycardia, likely reactive >> improved. P:  -monitor hemodynamics  RENAL A:   Hyponatremia, Hyperkalemia - Resolved. Hypokalemia. Hypervolemia. P:   -even to negative fluid balance -f/u and replace electrolytes as needed -d/c foley  GASTROINTESTINAL A:  Perforated gastric ulcer s/p laparotomy. Dysphagia. P:   -continue BID protonix -advance diet per CCS -wound care per CCS -seen by speech 12/03 >> clear liquid diet  HEMATOLOGIC A:   Acute on Chronic Anemia. Thrombocytopenia - improved. Leukocytosis - Resolved. P:  -f/u CBC intermittently -lovenox for DVT prevention  INFECTIOUS A:   Peritonitis 2nd to perforated gastric ulcer - completed Abx 12/01. P:   -monitor off abx  ENDOCRINE A:  Relative adrenal insufficiency. Hyperglycemia. P:   -change to solumedrol 10 mg daily on 12/02 -SSI  NEUROLOGIC A:  Post op pain control. Deconditioning. P:   -Fentanyl prn -mobilize as tolerated -PT assessment >> recommending SNF placement  Goals of care >> DNR/DNI  Summary: Transfer floor bed.  Will need to arrange for SNF placement.  Eventually she will re-enroll in outpt hospice.  She iwill need f/u with Dr. Dudley Major in [facility] pulmonary.  Updated pt's son at bedside about plan.  Coralyn Helling, MD [facility] Pulmonary/Critical Care 09/03/2013, 9:28 AM Pager:  [phone] After 3pm call: [phone]

## 2013-09-03 NOTE — Progress Notes (Signed)
eLink Physician-Brief Progress Note Patient Name: Tanya Horne DOB: 1951-09-14 MRN: [id number]  Date of Service  09/03/2013   HPI/Events of Note  Hypokalemia   eICU Interventions  Potassium replaced   Intervention Category Intermediate Interventions: Electrolyte abnormality - evaluation and management  Liza Czerwinski 09/03/2013, 5:38 AM

## 2013-09-03 NOTE — Progress Notes (Signed)
8 Days Post-Op  Subjective: NG clamped, no nausea of vomiting, breathing seems fairly comfortable while her head is up.    Objective: Vital signs in last 24 hours: Temp:  [98.2 F (36.8 C)-98.8 F (37.1 C)] 98.4 F (36.9 C) (12/03 0400) Pulse Rate:  [78-122] 80 (12/03 0600) Resp:  [19-48] 26 (12/03 0400) BP: (125-160)/(64-119) 139/72 mmHg (12/03 0600) SpO2:  [98 %-100 %] 100 % (12/03 0600) Weight:  [56.3 kg (124 lb 1.9 oz)] 56.3 kg (124 lb 1.9 oz) (12/03 0500) Last BM Date: 09/02/13 5 stools yesterday Afebrile, RR up. K+3.1 UGI:No evidence of extravasated contrast from the stomach. There is irregularity in the distal antrum which is the presumed site of gastric ulcer.   Intake/Output from previous day: 12/02 0701 - 12/03 0700 In: 1433.3 [I.V.:1033.3; IV Piggyback:400] Out: 1845 [Urine:1775; Drains:70] Intake/Output this shift:    General appearance: alert, cooperative and no distress Resp: clear to auscultation bilaterally and anterior exam GI: soft tender, + BS, open wound looks good.  Lab Results:   Recent Labs  09/01/13 0336 09/02/13 0352  WBC 7.8 7.0  HGB 9.5* 9.1*  HCT 30.4* 30.4*  PLT 145* 173    BMET  Recent Labs  09/02/13 0352 09/03/13 0330  NA 137 141  K 3.2* 3.1*  CL 96 101  CO2 35* 35*  GLUCOSE 112* 103*  BUN 4* 4*  CREATININE 0.41* 0.48*  CALCIUM 8.5 8.8   PT/INR No results found for this basename: LABPROT, INR,  in the last 72 hours  No results found for this basename: AST, ALT, ALKPHOS, BILITOT, PROT, ALBUMIN,  in the last 168 hours   Lipase     Component Value Date/Time   LIPASE 46 08/26/2013 0505     Studies/Results: Dg Ugi W/water Sol Cm  09/02/2013   CLINICAL DATA:  Recent perforated gastric ulcer.  EXAM: WATER SOLUBLE UPPER GI SERIES  TECHNIQUE: Single-column upper GI series was performed using water soluble contrast.  CONTRAST:  50mL OMNIPAQUE IOHEXOL 300 MG/ML  SOLN  COMPARISON:  None.  FLUOROSCOPY TIME:  1 min 56 seconds   FINDINGS: Contrast was initially taken by mouth. Contrast was then injected through the indwelling NG tube. There is no evidence of extravasation of contrast from the esophagus or stomach or duodenal bulb or C-loop.  There is an irregularity of the distal antrum which is probably the site of the previous perforated gastric ulcer. The mucosa in the stomach is slightly prominent. Duodenal bulb and C-loop appear normal.  IMPRESSION: No evidence of extravasated contrast from the stomach. There is irregularity in the distal antrum which is the presumed site of gastric ulcer.   Electronically Signed   By: Geanie Cooley M.D.   On: 09/02/2013 16:53    Medications: . albuterol  2.5 mg Nebulization Q6H WA  . antiseptic oral rinse  15 mL Mouth Rinse QID  . chlorhexidine  15 mL Mouth Rinse BID  . enoxaparin (LOVENOX) injection  40 mg Subcutaneous Q24H  . insulin aspart  0-15 Units Subcutaneous Q4H  . methylPREDNISolone (SOLU-MEDROL) injection  10 mg Intravenous Daily  . pantoprazole (PROTONIX) IV  40 mg Intravenous Q12H  . 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  LOS: 8 days    Diyan Dave 09/03/2013

## 2013-09-03 NOTE — Progress Notes (Signed)
PT Cancellation Note  Patient Details Name: Tanya Horne MRN: [id number] DOB: 05/23/1951   Cancelled Treatment:    Reason Eval/Treat Not Completed: Patient declined, no reason specified Pt states she's not moving today.  Waiting to transfer up to floor from ICU.   Dariel Betzer,KATHrine E 09/03/2013, 10:04 AM Zenovia Jarred, PT, DPT 09/03/2013 Pager: [phone]

## 2013-09-03 NOTE — Evaluation (Signed)
Clinical/Bedside Swallow Evaluation Patient Details  Name: Tanya Horne MRN: [id number] Date of Birth: [birth year]  Today's Date: 09/03/2013 Time: 4098-1191 SLP Time Calculation (min): 20 min  Past Medical History:  Past Medical History  Diagnosis Date  . Fibroid    Past Surgical History:  Past Surgical History  Procedure Laterality Date  . Laparotomy  08/26/2013    Procedure: EXPLORATORY LAPAROTOMY closure of gastric ulcer;  Surgeon: [name], MD;  Location: [facility] ORS;  Service: General;;   HPI:  62 yo female previously being followed by hospice admitted with perforated gastric ulcer with localized peritonitis.  PMH + for severe pulmonary fibrosis, chronic immunosuppression, ESRD.  Pt is s/p exploratory lap 11/25 - required ongoing respiratory support- extubated 11/30.  She has had an NG tube in place and is cleared to consume clear liquids if passes swallow evaluation.     Assessment / Plan / Recommendation Clinical Impression  Pt presents with acute on chronic progressive dysphagia likely due to pt's lung disease- as pt has no focal cranial nerve deficits and no significant neurological hx.  Pt admits to dysphagia (coughing with liquids x 3 years that is progressive).     No overt coughing observed with minimal intake observed *jello, water, grape juice*.  Delayed oral transiting noted, suspect compensatory for chronic dysphagia.  Multiple swallows noted across consistencies with pt reporting pharyngeal stasis sensation with jello.   Exhalation noted after the swallow maximizing airway protection.  Pt's dysphonia/nearly aphonia decreases airway protection, but pt reports current swallow ability to be baseline.  *which she states is not baseline - suspect intubation contributing.  Chronic throat clearing observed with and without intake, again pt reports to be baseline and attributes this to her lung disease.   SLP educated pt to dysphagia/aspiration mitigation strategies  including administrating liquids vis tsp if needed, conducting chin tuck if decreases cough and taking rest breaks if dyspenic.  Pt admits that she used to enjoy eating but not since she has become sick.  Further advised pt to items better tolerated if aspirated *eg: water and to be vigilant re: aspiration precautions at this time.   Used teach back for comprehension.    As pt has been managing her dysphagia, recommend to advance to clears - when advancing solids rec soft as pt is edentulous and does not use dentures.  All education completed,  thanks for this referral.     Aspiration Risk  Moderate    Diet Recommendation Thin liquid (clears per surgery md)   Liquid Administration via: Cup;Spoon;Straw Medication Administration:  (as tolerated) Supervision: Patient able to self feed Compensations: Slow rate;Small sips/bites (frequent rest breaks as indicated) Postural Changes and/or Swallow Maneuvers: Seated upright 90 degrees;Upright 30-60 min after meal    Other  Recommendations Oral Care Recommendations: Oral care BID Other Recommendations: Clarify dietary restrictions   Follow Up Recommendations  None    Frequency and Duration   n/a     Pertinent Vitals/Pain Afebrile, decreased,    SLP Swallow Goals     Swallow Study Prior Functional Status   see hhx section     General Date of Onset: 09/03/13 HPI: 62 yo female previously being followed by hospice admitted with perforated gastric ulcer with localized peritonitis.  PMH + for severe pulmonary fibrosis, chronic immunosuppression, ESRD.  Pt is s/p exploratory lap 11/25 - required ongoing respiratory support- extubated 11/30.  She has had an NG tube in place and is cleared to consume clear liquids if passes  swallow evaluation.   Type of Study: Bedside swallow evaluation Diet Prior to this Study: NPO Temperature Spikes Noted: No Respiratory Status: Nasal cannula Behavior/Cognition: Alert;Cooperative Oral Cavity - Dentition:  Edentulous (does not wear dentures) Self-Feeding Abilities: Able to feed self Patient Positioning: Upright in bed Baseline Vocal Quality: Low vocal intensity;Breathy (nearly aphonic) Volitional Cough: Strong Volitional Swallow: Able to elicit    Oral/Motor/Sensory Function Overall Oral Motor/Sensory Function: Appears within functional limits for tasks assessed   Ice Chips Ice chips: Within functional limits Presentation: Spoon   Thin Liquid Thin Liquid: Impaired Presentation: Straw;Spoon;Cup;Self Fed Oral Phase Impairments: Impaired anterior to posterior transit Oral Phase Functional Implications: Prolonged oral transit Pharyngeal  Phase Impairments: Throat Clearing - Immediate;Throat Clearing - Delayed;Multiple swallows Other Comments: delayed swallow initiation, suspect compensatory    Nectar Thick Nectar Thick Liquid: Not tested   Honey Thick Honey Thick Liquid: Not tested   Puree Puree: Impaired (JELLO) Presentation: Spoon Oral Phase Impairments: Reduced lingual movement/coordination;Impaired anterior to posterior transit Oral Phase Functional Implications: Prolonged oral transit Pharyngeal Phase Impairments: Suspected delayed Swallow;Multiple swallows Other Comments: pt did report sensation of pharyngeal stasis with jello, clearing with liquids   Solid   GO    Solid: Not tested Other Comments: pt on clears only       Donavan Burnet, MS [facility] SLP [phone]

## 2013-09-04 LAB — CBC
HCT: 31.8 % — ABNORMAL LOW (ref 36.0–46.0)
Hemoglobin: 9.4 g/dL — ABNORMAL LOW (ref 12.0–15.0)
Hemoglobin: 10 g/dL — ABNORMAL LOW (ref 12.0–15.0)
MCH: 29.3 pg (ref 26.0–34.0)
MCHC: 29.6 g/dL — ABNORMAL LOW (ref 30.0–36.0)
MCV: 99.1 fL (ref 78.0–100.0)
Platelets: 248 10*3/uL (ref 150–400)
Platelets: 284 10*3/uL (ref 150–400)
RBC: 3.21 MIL/uL — ABNORMAL LOW (ref 3.87–5.11)
RBC: 3.32 MIL/uL — ABNORMAL LOW (ref 3.87–5.11)
WBC: 6.7 10*3/uL (ref 4.0–10.5)
WBC: 9.9 10*3/uL (ref 4.0–10.5)

## 2013-09-04 LAB — GLUCOSE, CAPILLARY
Glucose-Capillary: 117 mg/dL — ABNORMAL HIGH (ref 70–99)
Glucose-Capillary: 86 mg/dL (ref 70–99)

## 2013-09-04 LAB — TYPE AND SCREEN
ABO/RH(D): A POS
Antibody Screen: NEGATIVE

## 2013-09-04 LAB — BASIC METABOLIC PANEL
CO2: 32 mEq/L (ref 19–32)
Calcium: 9 mg/dL (ref 8.4–10.5)
Chloride: 101 mEq/L (ref 96–112)
GFR calc non Af Amer: >90 mL/min (ref >90)
Glucose, Bld: 97 mg/dL (ref 70–99)
Potassium: 4 mEq/L (ref 3.5–5.1)
Sodium: 139 mEq/L (ref 135–145)

## 2013-09-04 LAB — OCCULT BLOOD X 1 CARD TO LAB, STOOL: Fecal Occult Bld: NEGATIVE

## 2013-09-04 MED ORDER — SODIUM CHLORIDE 0.9 % IV SOLN
INTRAVENOUS | Status: DC | PRN
  Administered 2013-09-04: 12:00:00 via INTRAVENOUS

## 2013-09-04 MED ORDER — ACETAMINOPHEN 325 MG PO TABS: 650.0000 mg | ORAL_TABLET | Freq: Four times a day (QID) | ORAL | Status: DC | PRN

## 2013-09-04 MED ORDER — ENSURE COMPLETE PO LIQD
237.0000 mL | Freq: Two times a day (BID) | ORAL | Status: DC
  Administered 2013-09-06 – 2013-09-07 (×3): 237 mL via ORAL

## 2013-09-04 MED ORDER — INSULIN ASPART 100 UNIT/ML ~~LOC~~ SOLN: 0.0000 [IU] | Freq: Three times a day (TID) | SUBCUTANEOUS | Status: DC

## 2013-09-04 MED ORDER — PANTOPRAZOLE SODIUM 40 MG PO TBEC
40.0000 mg | DELAYED_RELEASE_TABLET | Freq: Two times a day (BID) | ORAL | Status: DC
  Administered 2013-09-05 – 2013-09-08 (×8): 40 mg via ORAL
  Filled 2013-09-04 (×10): qty 1

## 2013-09-04 MED ORDER — MENTHOL 3 MG MT LOZG
1.0000 | LOZENGE | OROMUCOSAL | Status: DC | PRN
  Administered 2013-09-04 – 2013-09-06 (×7): 3 mg via ORAL
  Filled 2013-09-04 (×5): qty 9

## 2013-09-04 MED ORDER — PREDNISONE 5 MG PO TABS
5.0000 mg | ORAL_TABLET | Freq: Every day | ORAL | Status: DC
  Administered 2013-09-05 – 2013-09-08 (×4): 5 mg via ORAL
  Filled 2013-09-04 (×5): qty 1

## 2013-09-04 NOTE — Progress Notes (Signed)
PULMONARY  / CRITICAL CARE MEDICINE  Name: Tanya Horne MRN: [id number] DOB: 1951-07-27    ADMISSION DATE:  08/26/2013   REFERRING MD :  EDP PRIMARY SERVICE: PCCM  CHIEF COMPLAINT:   Abdominal pain and found to have Intra-abdominal viscus perforation on CT scan  BRIEF PATIENT DESCRIPTION:  62 yo female transferred from [facility] with abdominal pain, pneumoperitoneum from perforated gastric ulcer.  She has hx of severe pulmonary fibrosis on chronic immunosuppression (followed at [facility]) and chronic hypoxic respiratory failure.  She was DNR and hospice care >> temporarily reversed for surgical intervention.  SIGNIFICANT EVENTS: 11/25 Exploratory laparotomy, closure perforated gastric ulcer, omental patch, abdominal wound closure with retention sutures; remained on vent post-op 11/26 Hb drop from 10.8 to 8.1 11/27 Hb drop to 7.4 12/02 Spoke with [facility] >> plan to change prednisone to 5 mg daily, and not resume cellcept 12/03 Transfer to floor  STUDIES:  11/25 CT abd (@ [facility]) >> free intra-abdominal gas with apparent extravasation of enteral contrast from greater stomach curvature  LINES / TUBES: ET Tube 11/25 >>>11/30  CULTURES: 11/25 Abdomen Fluid Culture >>>  Few G(+) Rods 11/25 Abdomen Fluid Anearobic Culture >>> Few G(+) Rods  ANTIBIOTICS: Fluconazole 11/25 >>12/01 Zosyn 11/25 >>>11/28 11/28 unasyn>>>12/01  SUBJECTIVE:  Abdominal pain improved.  Having BM's.  Breathing stable.  VITAL SIGNS: Temp:  [98.1 F (36.7 C)-98.5 F (36.9 C)] 98.1 F (36.7 C) (12/04 0439) Pulse Rate:  [102-115] 102 (12/04 0439) Resp:  [21-24] 22 (12/04 0439) BP: (114-132)/(71-75) 128/75 mmHg (12/04 0439) SpO2:  [98 %-100 %] 98 % (12/04 0748) Weight:  [121 lb 4.1 oz (55 kg)] 121 lb 4.1 oz (55 kg) (12/03 1203) 4 L [place]  INTAKE / OUTPUT: Intake/Output     12/03 0701 - 12/04 0700 12/04 0701 - 12/05 0700   P.O. 1200    I.V. (mL/kg) 155.8 (2.8)    IV Piggyback     Total Intake(mL/kg)  1355.8 (24.7)    Urine (mL/kg/hr) 1100 (0.8)    Drains     Total Output 1100     Net +255.8          Urine Occurrence 1 x 1 x   Stool Occurrence 2 x 2 x     PHYSICAL EXAMINATION: General: No distress Neuro: normal strength HEENT: no sinus tenderness Cardiovascular: regular Lungs: b/l crackles, no wheeze Abdomen: wound dressing clean Ext: no edema   LABS: CBC Recent Labs     09/02/13  0352  09/04/13  0454  WBC  7.0  6.7  HGB  9.1*  9.4*  HCT  30.4*  31.8*  PLT  173  248   BMET Recent Labs     09/02/13  0352  09/03/13  0330  09/04/13  0454  NA  137  141  139  K  3.2*  3.1*  4.0  CL  96  101  101  CO2  35*  35*  32  BUN  4*  4*  3*  CREATININE  0.41*  0.48*  0.51  GLUCOSE  112*  103*  97    Electrolytes Recent Labs     09/02/13  0352  09/03/13  0330  09/04/13  0454  CALCIUM  8.5  8.8  9.0  MG   --   1.9   --     Glucose Recent Labs     09/03/13  1202  09/03/13  1621  09/03/13  2003  09/04/13  0022  09/04/13  0437  09/04/13  0812  GLUCAP  101*  120*  180*  86  86  106*    Imaging Dg Ugi W/water Sol Cm  09/02/2013   CLINICAL DATA:  Recent perforated gastric ulcer.  EXAM: WATER SOLUBLE UPPER GI SERIES  TECHNIQUE: Single-column upper GI series was performed using water soluble contrast.  CONTRAST:  50mL OMNIPAQUE IOHEXOL 300 MG/ML  SOLN  COMPARISON:  None.  FLUOROSCOPY TIME:  1 min 56 seconds  FINDINGS: Contrast was initially taken by mouth. Contrast was then injected through the indwelling NG tube. There is no evidence of extravasation of contrast from the esophagus or stomach or duodenal bulb or C-loop.  There is an irregularity of the distal antrum which is probably the site of the previous perforated gastric ulcer. The mucosa in the stomach is slightly prominent. Duodenal bulb and C-loop appear normal.  IMPRESSION: No evidence of extravasated contrast from the stomach. There is irregularity in the distal antrum which is the presumed site of gastric  ulcer.   Electronically Signed   By: Geanie Cooley M.D.   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  On chronic immunosuppression as outpt (cellcept, prednisone). P:   -f/u CXR as needed -bronchial hygiene -oxygen to keep SpO2 > 88% -d/c solumedrol and start prednisone 5 mg daily on 12/04 -will not resume cellcept until re-evaluated as outpt with [facility] pulmonary; also will not need to resume dapsone as outpt -continue BD's  CARDIOVASCULAR A:  Sinus tachycardia, likely reactive >> improved. P:  -monitor hemodynamics  RENAL A:   Hyponatremia, Hyperkalemia - Resolved. Hypokalemia - improved. Hypervolemia - improved. P:   -even fluid balance -f/u and replace electrolytes as needed  GASTROINTESTINAL A:  Perforated gastric ulcer s/p laparotomy. Dysphagia. P:   -continue BID protonix -advance diet per CCS -wound care per [facility] -f/u with speech therapy  HEMATOLOGIC A:   Acute on Chronic Anemia. Thrombocytopenia - resolved. Leukocytosis - Resolved. P:  -f/u CBC intermittently -lovenox for DVT prevention  INFECTIOUS A:   Peritonitis 2nd to perforated gastric ulcer - completed Abx 12/01. P:   -monitor off abx  ENDOCRINE A:  Relative adrenal insufficiency - resolved. Steroid induced hyperglycemia. P:   -SSI >> likely able to d/c after decrease in dose of steroids -d/c dextrose from IV fluids  NEUROLOGIC A:  Post op pain control. Deconditioning. P:   -Tylenol, Fentanyl prn -mobilize as tolerated -PT assessment >> recommending SNF placement  Goals of care >> DNR/DNI  Summary: Medically stable for transfer to SNF >> will need surgery input on when she is ready.  Will have social worker arrange for SNF placement.  Eventually she will re-enroll in outpt hospice.  She will need f/u with Dr. Dudley Major in [facility] pulmonary.  Coralyn Helling, MD [facility] Pulmonary/Critical  Care 09/04/2013, 9:44 AM Pager:  [phone] After 3pm call: [phone]

## 2013-09-04 NOTE — Progress Notes (Signed)
Clinical Social Work Department BRIEF PSYCHOSOCIAL ASSESSMENT 09/04/2013  Patient:  Tanya Horne, Tanya Horne     Account Number:  [id number]     Admit date:  08/26/2013  Clinical Social Worker:  Tanya Horne  Date/Time:  09/04/2013 11:19 AM  Referred by:  Physician  Date Referred:  09/04/2013 Referred for  SNF Placement   Other Referral:   Interview type:  Patient Other interview type:   and patient son    PSYCHOSOCIAL DATA Living Status:  FAMILY Admitted from facility:   Level of care:   Primary support name:  Tanya Horne ZOXW/RUE/[id number] Primary support relationship to patient:  CHILD, ADULT Degree of support available:   adequate    CURRENT CONCERNS Current Concerns  Post-Acute Placement   Other Concerns:    SOCIAL WORK ASSESSMENT / PLAN CSW received referral for New SNF placement.    CSW reviewed chart and noted that pt admitted from [facility] with [facility].    CSW met with pt at bedside to discuss disposition planning. Pt confirmed that she was from home with pt son and hospice care. CSW discussed with pt MD recommendation to explore going to a facility prior to discharge home. Pt states to me that the plan is for hospice. Pt asked CSW to contact pt son to discuss.    CSW contacted pt son, Tanya Horne via telephone. Pt son reports that when pt speaks about hospice that the pt is referring going to the hospice home. Pt son reports that he has spoken with someone that follows pt from home and they stated that pt could go to [facility] for therapy before returning home. CSW discussed that CSW would have to speak with the individual from [facility] of [place] because this CSW understanding is that Hospice Home is for end of life care. Pt son agreeable to CSW contacting [facility] to inquire more.    CSW inquired with pt son about if pt had applied for Medicaid. Pt son discussed that pt does not currently qualify for Medicaid due to pt having  finances from recently receiving 401K from previous employment. CSW discussed that CSW would have to discuss with CSW department supervisor, but CSW did not anticipate that hospital would be able to provide any assistance with placement due to pt not qualifying for Medicaid. CSW discussed that options at that point would be clarifying with the hospice home if they can accept pt for a short term, pt returning home with hospice, or private pay at [facility]. Pt son stated that although pt has finances that do not qualify pt for Medicaid, the pt does not have the finances to pay privately at [facility].    CSW spoke with CSW department director, [facility] Rife about situation. Per CSW department director, [facility] Rife the pt is not appropriate for Letter of Guarantee (LOG) due to pt not qualifying for Medicaid at this time and that only options would be return home with hospice or private pay at [facility].    CSW contacted pt son and notified that hospital would not be able to provide assistance for placement. CSW offered to explore SNF options and get private pay rates. Pt son declined and stated that only options would be Hospice Home if they were able to accept or for pt to return home with Hospice Care.    CSW contacted [facility] and left message for pt home hospice social worker. CSW awaiting return phone call.    CSW to continue to  follow to assist with pt disposition needs.   Assessment/plan status:  Psychosocial Support/Ongoing Assessment of Needs Other assessment/ plan:   discharge planning   Information/referral to community resources:   Awaiting return phone call from [facility] [place].  Pt son refused SNF search for private pay rates    PATIENT'S/FAMILY'S RESPONSE TO PLAN OF CARE: Pt alert and oriented x 4, but withdrawn and not actively engaged in conversation. Pt son appears supportive and actively involved in pt care. Pt son recognizes that SNF is not an option unless pt willing to private  pay and pt son states that this time that pt is not in the position to private pay. Pt son hopeful that [facility] will be an option as someone from [facility] [place] has discussed this with him.    Tanya Horne, MSW, LCSWA  Clinical Social Work [phone]

## 2013-09-04 NOTE — Progress Notes (Signed)
CSW received return phone call from [facility] [address] Social Worker, [place].  [facility] [place] Social Worker discussed that [facility] would not be appropriate at this time for pt as Hospice Home is not for rehabilitation. [facility] [place] Social Worker discussed that pt son may have perceived that Hospice Home was an option as Physicist, medical recently discussed Hospice Home for symptom management at a time when pt symptoms were exacerbated at home.   Hospice Social Worker was familiar with pt financial situation and not currently qualifying for Medicaid. Hospice Social Worker discussed that pt son has been making the appropriate steps in hopes that pt will soon qualify for Medicaid. CSW discussed with Hospice Social Worker recommendation for physical therapy. [facility] [place] social worker discussed that once pt returned home then [facility] [place] would evaluate for physical therapy in the home. [facility] Social Worker discussed that pt son is a strong support for pt and has been a very good caregiver to pt.   CSW contacted pt son via telephone. CSW informed pt son that [facility] was not an option at this time. CSW offered again to inquire with SNF facilities about private pay rates, but pt son declined and stated that pt or pt son would be unable to pay privately at [facility].  Pt son agreeable to pt returning home with hospice care from [facility] [place].   CSW discussed with pt at bedside and explained that currently the only option is to return home with son and hospice care and hospice would evaluate for physical therapy in the home. Pt stated that her plan the whole time was to return home with pt son and hospice care.   CSW to notify RNCM in order for RNCM to assist with any further home care needs.   CSW to continue to follow and assist as appropriate.   Jacklynn Lewis, MSW, LCSWA  Clinical Social Work [phone]

## 2013-09-04 NOTE — Progress Notes (Signed)
RN was called into room by nurse tech pt. Had a loose bowel movement that was bright red in color.  MD notified. Waiting for new orders. Will continue to monitor.

## 2013-09-04 NOTE — Progress Notes (Signed)
Physical Therapy Treatment Patient Details Name: Tanya Horne MRN: [id number] DOB: 19-Dec-1950 Today's Date: 09/04/2013 Time: 4098-1191 PT Time Calculation (min): 28 min  PT Assessment / Plan / Recommendation  History of Present Illness 62 yo female transferred from [facility] with abdominal pain, pneumoperitoneum from perforated gastric ulcer.  She has hx of severe pulmonary fibrosis on chronic immunosuppression (followed at [facility]) and chronic hypoxic respiratory failure.  She was DNR and hospice care >> temporarily reversed for surgical intervention.  11/25 Exploratory laparotomy, closure perforated gastric ulcer, omental patch, abdominal wound closure with retention sutures; remained on vent post-op and extubated 11/30   PT Comments   Progressing very slowly with mobility. Pt preferred to attempt ambulation in room. Pt is very deconditioned-only able to take ~5-6 steps to chair across room. C/o dizziness and abdominal pain with activity. Mod encouragement for pt to remain OOB as tolerated.   Follow Up Recommendations  SNF     Does the patient have the potential to tolerate intense rehabilitation     Barriers to Discharge        Equipment Recommendations  Rolling walker with 5" wheels    Recommendations for Other Services OT consult  Frequency Min 3X/week   Progress towards PT Goals Progress towards PT goals: Progressing toward goals (very slowly)  Plan Current plan remains appropriate    Precautions / Restrictions Precautions Precautions: Fall Precaution Comments: chronic O2, abd surgery Restrictions Weight Bearing Restrictions: No   Pertinent Vitals/Pain Abdomen 5/10 with activity End of session vitals: 125/71, 111 bpm, 100% 4L O2    Mobility  Bed Mobility Bed Mobility: Supine to Sit Supine to Sit: HOB elevated;4: Min guard Details for Bed Mobility Assistance: HOB 45 degrees. Pt able to get to EOB unassisted.  Transfers Transfers: Pharmacologist;Sit to Stand;Stand  to Sit Sit to Stand: 3: Mod assist;From bed Stand to Sit: 3: Mod assist;To chair/3-in-1 Stand Pivot Transfers: 3: Mod assist Details for Transfer Assistance: multiple attempts for pt to rise without assistance. Needed assist to rise, stabilze, control descent. Stand pivot to recliner after short walk in room due to pt fatigued, c/o dizziness.  Ambulation/Gait Ambulation/Gait Assistance: 3: Mod assist Ambulation Distance (Feet): 5 Feet Assistive device: 2 person hand held assist Ambulation/Gait Assistance Details: 1 HHA and pt held onto IV pole. Discussed need for walker. Slow gait speed. fatigues very easily. Dyspnea 4/4 with short distance. Remained on O2 (bumped up to 6L briefly for recovery) Gait Pattern: Decreased step length - right;Decreased step length - left    Exercises     PT Diagnosis:    PT Problem List:   PT Treatment Interventions:     PT Goals (current goals can now be found in the care plan section)    Visit Information  Last PT Received On: 09/04/13 Assistance Needed: +2 (safety) History of Present Illness: 62 yo female transferred from [facility] with abdominal pain, pneumoperitoneum from perforated gastric ulcer.  She has hx of severe pulmonary fibrosis on chronic immunosuppression (followed at [facility]) and chronic hypoxic respiratory failure.  She was DNR and hospice care >> temporarily reversed for surgical intervention.  11/25 Exploratory laparotomy, closure perforated gastric ulcer, omental patch, abdominal wound closure with retention sutures; remained on vent post-op and extubated 11/30    Subjective Data      Cognition  Cognition Arousal/Alertness: Awake/alert Behavior During Therapy: Flat affect Overall Cognitive Status: Within Functional Limits for tasks assessed    Balance  Balance Balance Assessed: Yes Static Standing Balance Static  Standing - Balance Support: Bilateral upper extremity supported Static Standing - Level of Assistance: 4: Min assist Dynamic  Standing Balance Dynamic Standing - Balance Support: Bilateral upper extremity supported Dynamic Standing - Level of Assistance: 3: Mod assist  End of Session PT - End of Session Equipment Utilized During Treatment: Oxygen Activity Tolerance: Patient limited by fatigue;Patient limited by pain Patient left: in chair;with call bell/phone within reach Nurse Communication: Mobility status   GP     Rebeca Alert, MPT Pager: [phone]

## 2013-09-04 NOTE — Progress Notes (Signed)
9 Days Post-Op  Subjective: She looks good, wound is healing nicely.  She is taking clear liquids and having BM's.  Objective: Vital signs in last 24 hours: Temp:  [98.1 F (36.7 C)-98.5 F (36.9 C)] 98.1 F (36.7 C) (12/04 0439) Pulse Rate:  [102-115] 102 (12/04 0439) Resp:  [21-24] 22 (12/04 0439) BP: (114-132)/(71-75) 128/75 mmHg (12/04 0439) SpO2:  [98 %-100 %] 98 % (12/04 0748) Weight:  [55 kg (121 lb 4.1 oz)] 55 kg (121 lb 4.1 oz) (12/03 1203) Last BM Date: 09/03/13 1200 PO 2 BM's Afebrile, VSS, still tachycardic with RR 20's Labs OK Intake/Output from previous day: 12/03 0701 - 12/04 0700 In: 1355.8 [P.O.:1200; I.V.:155.8] Out: 1100 [Urine:1100] Intake/Output this shift:    General appearance: alert, cooperative and no distress GI: abnormal findings:  soft, still tender and sore.  open wound looks fine + Bs, no distension.  Lab Results:   Recent Labs  09/02/13 0352 09/04/13 0454  WBC 7.0 6.7  HGB 9.1* 9.4*  HCT 30.4* 31.8*  PLT 173 248    BMET  Recent Labs  09/03/13 0330 09/04/13 0454  NA 141 139  K 3.1* 4.0  CL 101 101  CO2 35* 32  GLUCOSE 103* 97  BUN 4* 3*  CREATININE 0.48* 0.51  CALCIUM 8.8 9.0   PT/INR No results found for this basename: LABPROT, INR,  in the last 72 hours  No results found for this basename: AST, ALT, ALKPHOS, BILITOT, PROT, ALBUMIN,  in the last 168 hours   Lipase     Component Value Date/Time   LIPASE 46 08/26/2013 0505     Studies/Results: Dg Ugi W/water Sol Cm  09/02/2013   CLINICAL DATA:  Recent perforated gastric ulcer.  EXAM: WATER SOLUBLE UPPER GI SERIES  TECHNIQUE: Single-column upper GI series was performed using water soluble contrast.  CONTRAST:  50mL OMNIPAQUE IOHEXOL 300 MG/ML  SOLN  COMPARISON:  None.  FLUOROSCOPY TIME:  1 min 56 seconds  FINDINGS: Contrast was initially taken by mouth. Contrast was then injected through the indwelling NG tube. There is no evidence of extravasation of contrast from the  esophagus or stomach or duodenal bulb or C-loop.  There is an irregularity of the distal antrum which is probably the site of the previous perforated gastric ulcer. The mucosa in the stomach is slightly prominent. Duodenal bulb and C-loop appear normal.  IMPRESSION: No evidence of extravasated contrast from the stomach. There is irregularity in the distal antrum which is the presumed site of gastric ulcer.   Electronically Signed   By: Geanie Cooley M.D.   On: 09/02/2013 16:53    Medications: . albuterol  2.5 mg Nebulization Q6H WA  . antiseptic oral rinse  15 mL Mouth Rinse QID  . chlorhexidine  15 mL Mouth Rinse BID  . enoxaparin (LOVENOX) injection  40 mg Subcutaneous Q24H  . insulin aspart  0-15 Units Subcutaneous Q4H  . methylPREDNISolone (SOLU-MEDROL) injection  10 mg Intravenous Daily  . 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 Continue PT, OT as before.  Continue wet to dry dressing.  Called at 2:30 PM pt with BRB in bowel movement. BP down slightly from 3AM BP H/H OK earlier this AM. Nurse has found out she has a hx of anal fissure. 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  Abd soft, and she is on Bed pan but no abdominal complaints.  LOS: 9 days    Tanya Horne 09/04/2013

## 2013-09-05 LAB — CBC
HCT: 34.6 % — ABNORMAL LOW (ref 36.0–46.0)
MCH: 29.5 pg (ref 26.0–34.0)
MCHC: 29.5 g/dL — ABNORMAL LOW (ref 30.0–36.0)
MCV: 100 fL (ref 78.0–100.0)
Platelets: 270 10*3/uL (ref 150–400)
RBC: 3.46 MIL/uL — ABNORMAL LOW (ref 3.87–5.11)

## 2013-09-05 LAB — BASIC METABOLIC PANEL
BUN: 4 mg/dL — ABNORMAL LOW (ref 6–23)
CO2: 32 mEq/L (ref 19–32)
Calcium: 9.1 mg/dL (ref 8.4–10.5)
Chloride: 102 mEq/L (ref 96–112)
Creatinine, Ser: 0.5 mg/dL (ref 0.50–1.10)
Glucose, Bld: 91 mg/dL (ref 70–99)
Sodium: 141 mEq/L (ref 135–145)

## 2013-09-05 LAB — GLUCOSE, CAPILLARY: Glucose-Capillary: 91 mg/dL (ref 70–99)

## 2013-09-05 NOTE — Progress Notes (Signed)
10 Days Post-Op  Subjective: She is sitting up eating and looks good.  Wounds look good  Objective: Vital signs in last 24 hours: Temp:  [98.2 F (36.8 C)-98.9 F (37.2 C)] 98.2 F (36.8 C) (12/05 0620) Pulse Rate:  [95-118] 95 (12/05 0620) Resp:  [20] 20 (12/05 0620) BP: (121-127)/(68-69) 121/69 mmHg (12/05 0620) SpO2:  [99 %-100 %] 100 % (12/05 0916) Weight:  [53 kg (116 lb 13.5 oz)] 53 kg (116 lb 13.5 oz) (12/05 0620) Last BM Date: 09/04/13 PO 720 ml 5 stools yesterday Afebrile, Still tachycardic K+ 3.4 Intake/Output from previous day: 12/04 0701 - 12/05 0700 In: 720 [P.O.:720] Out: 1085 [Urine:1050; Drains:35] Intake/Output this shift: Total I/O In: -  Out: 200 [Urine:200]  General appearance: alert, cooperative and no distress GI: soft sore, no distension, +BS.  Wound has been doing well.  Lab Results:   Recent Labs  09/04/13 1507 09/05/13 0450  WBC 9.9 6.7  HGB 10.0* 10.2*  HCT 33.3* 34.6*  PLT 284 270    BMET  Recent Labs  09/04/13 0454 09/05/13 0450  NA 139 141  K 4.0 3.4*  CL 101 102  CO2 32 32  GLUCOSE 97 91  BUN 3* 4*  CREATININE 0.51 0.50  CALCIUM 9.0 9.1   PT/INR No results found for this basename: LABPROT, INR,  in the last 72 hours  No results found for this basename: AST, ALT, ALKPHOS, BILITOT, PROT, ALBUMIN,  in the last 168 hours   Lipase     Component Value Date/Time   LIPASE 46 08/26/2013 0505     Studies/Results: No results found.  Medications: . albuterol  2.5 mg Nebulization Q6H WA  . antiseptic oral rinse  15 mL Mouth Rinse QID  . chlorhexidine  15 mL Mouth Rinse BID  . enoxaparin (LOVENOX) injection  40 mg Subcutaneous Q24H  . feeding supplement (ENSURE COMPLETE)  237 mL Oral BID BM  . pantoprazole  40 mg Oral BID AC  . 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  LOS: 10 days    Irfan Veal 09/05/2013

## 2013-09-05 NOTE — Progress Notes (Signed)
PULMONARY  / CRITICAL CARE MEDICINE  Name: Tanya Horne MRN: [id number] DOB: [birth year]    ADMISSION DATE:  08/26/2013   REFERRING MD :  EDP PRIMARY SERVICE: PCCM  CHIEF COMPLAINT:   Abdominal pain and found to have Intra-abdominal viscus perforation on CT scan  BRIEF PATIENT DESCRIPTION:  62 yo female transferred from [facility] with abdominal pain, pneumoperitoneum from perforated gastric ulcer.  She has hx of severe pulmonary fibrosis on chronic immunosuppression (followed at [facility]) and chronic hypoxic respiratory failure.  She was DNR and hospice care >> temporarily reversed for surgical intervention.  SIGNIFICANT EVENTS: 11/25 Exploratory laparotomy, closure perforated gastric ulcer, omental patch, abdominal wound closure with retention sutures; remained on vent post-op 11/26 Hb drop from 10.8 to 8.1 11/27 Hb drop to 7.4 12/02 Spoke with [facility] >> plan to change prednisone to 5 mg daily, and not resume cellcept 12/03 Transfer to floor 12/04 Blood in BM   STUDIES:  11/25 CT abd (@ [facility]) >> free intra-abdominal gas with apparent extravasation of enteral contrast from greater stomach curvature  LINES / TUBES: ET Tube 11/25 >>>11/30  CULTURES: 11/25 Abdomen Fluid Culture >>>  Few G(+) Rods 11/25 Abdomen Fluid Anearobic Culture >>> Few G(+) Rods  ANTIBIOTICS: Fluconazole 11/25 >>12/01 Zosyn 11/25 >>>11/28 11/28 unasyn>>>12/01  SUBJECTIVE:  Abdominal pain improved.  Having BM's.  Breathing stable.  Had blood in BM yesterday >> no further since.  VITAL SIGNS: Temp:  [98.2 F (36.8 C)-98.9 F (37.2 C)] 98.2 F (36.8 C) (12/05 0620) Pulse Rate:  [95-118] 95 (12/05 0620) Resp:  [20] 20 (12/05 0620) BP: (115-127)/(65-69) 121/69 mmHg (12/05 0620) SpO2:  [99 %-100 %] 100 % (12/05 0916) Weight:  [116 lb 13.5 oz (53 kg)] 116 lb 13.5 oz (53 kg) (12/05 0620) 4 L [place]  INTAKE / OUTPUT: Intake/Output     12/04 0701 - 12/05 0700 12/05 0701 - 12/06 0700   P.O. 720     I.V. (mL/kg)     Total Intake(mL/kg) 720 (13.6)    Urine (mL/kg/hr) 1050 (0.8) 200 (1)   Drains 35 (0)    Total Output 1085 200   Net -365 -200        Urine Occurrence 5 x    Stool Occurrence 5 x      PHYSICAL EXAMINATION: General: No distress, feels weak. Neuro: normal strength HEENT: no sinus tenderness Cardiovascular: regular Lungs: Decreased in bases Abdomen: wound dressing clean Ext: no edema   LABS: CBC Recent Labs     09/04/13  0454  09/04/13  1507  09/05/13  0450  WBC  6.7  9.9  6.7  HGB  9.4*  10.0*  10.2*  HCT  31.8*  33.3*  34.6*  PLT  248  284  270   BMET Recent Labs     09/03/13  0330  09/04/13  0454  09/05/13  0450  NA  141  139  141  K  3.1*  4.0  3.4*  CL  101  101  102  CO2  35*  32  32  BUN  4*  3*  4*  CREATININE  0.48*  0.51  0.50  GLUCOSE  103*  97  91    Electrolytes Recent Labs     09/03/13  0330  09/04/13  0454  09/05/13  0450  CALCIUM  8.8  9.0  9.1  MG  1.9   --    --     Glucose Recent Labs  09/04/13  0437  09/04/13  0812  09/04/13  1223  09/04/13  1636  09/04/13  2122  09/05/13  0825  GLUCAP  86  106*  117*  98  129*  91    Imaging No results found.   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  On chronic immunosuppression as outpt (cellcept, prednisone). P:   -f/u CXR as needed -bronchial hygiene -oxygen to keep SpO2 > 88% -changed to prednisone 5 mg daily on 12/04 -will not resume cellcept until re-evaluated as outpt with [facility] pulmonary; also will not need to resume dapsone as outpt -continue BD's  CARDIOVASCULAR A:  Sinus tachycardia, likely reactive >> improved. P:  -monitor hemodynamics  RENAL A:   Hyponatremia, Hyperkalemia - Resolved. Hypokalemia - improved. Hypervolemia - improved. P:   -even fluid balance -f/u and replace electrolytes as needed  GASTROINTESTINAL A:  Perforated gastric ulcer s/p  laparotomy. Dysphagia. Report of blood in BM 12/04 >> no further episodes since, and Hb stable. P:   -continue BID protonix -advance diet per [facility] -wound care per [facility], change dressing tid.  HEMATOLOGIC A:   Acute on Chronic Anemia. Thrombocytopenia - resolved. Leukocytosis - Resolved. P:  -f/u CBC intermittently -okay to continue lovenox for DVT prevention  INFECTIOUS A:   Peritonitis 2nd to perforated gastric ulcer - completed Abx 12/01. P:   -monitor off abx  ENDOCRINE A:  Relative adrenal insufficiency - resolved. Steroid induced hyperglycemia. P:   -d/c SSI  NEUROLOGIC A:  Post op pain control. Deconditioning. P:   -Tylenol, Fentanyl prn -mobilize as tolerated -PT assessment >> recommending SNF placement >> not an option due to lack of insurance approval  Goals of care >> DNR/DNI  Summary: Medically stable for transfer to SNF >>but not able to afford at this time. May not be ready for home at this time since she was going to SNF which is 24/7 care.   Brett Canales Minor ACNP [facility] PCCM Pager [phone] till 3 pm If no answer page [phone] 09/05/2013, 10:48 AM   Reviewed above, examined pt, and agree with assessment/plan.  She is still quite weak, and not ready for d/c straight to home yet.  Unfortunately SNF placement not option for her due to finances.  Will likely need to stay in hospital over weekend, and then re-assess fitness for d/c home early next week.  Coralyn Helling, MD [facility] Pulmonary/Critical Care 09/05/2013, 12:03 PM Pager:  [phone] After 3pm call: [phone]

## 2013-09-05 NOTE — Progress Notes (Signed)
Physical Therapy Treatment Patient Details Name: Porter Nakama MRN: [id number] DOB: 12/22/1950 Today's Date: 09/05/2013 Time: 4098-1191 PT Time Calculation (min): 12 min  PT Assessment / Plan / Recommendation  History of Present Illness 62 yo female transferred from [facility] with abdominal pain, pneumoperitoneum from perforated gastric ulcer.  She has hx of severe pulmonary fibrosis on chronic immunosuppression (followed at [facility]) and chronic hypoxic respiratory failure.  She was DNR and hospice care >> temporarily reversed for surgical intervention.  11/25 Exploratory laparotomy, closure perforated gastric ulcer, omental patch, abdominal wound closure with retention sutures; remained on vent post-op and extubated 11/30   PT Comments   Pt agreeable to ambulate around bed to recliner in room.  Pt reports plan is for return home with son.  Follow Up Recommendations  Other (comment) (plan is for home with hospice, HHPT per hospice)     Does the patient have the potential to tolerate intense rehabilitation     Barriers to Discharge        Equipment Recommendations  Rolling walker with 5" wheels    Recommendations for Other Services    Frequency Min 3X/week   Progress towards PT Goals Progress towards PT goals: Progressing toward goals  Plan Current plan remains appropriate    Precautions / Restrictions Precautions Precautions: Fall Precaution Comments: chronic O2, abd surgery   Pertinent Vitals/Pain Pt states no pain    Mobility  Bed Mobility Bed Mobility: Supine to Sit Supine to Sit: 5: Supervision Details for Bed Mobility Assistance: pt sat up straight in bed then swung around lower body Transfers Transfers: Sit to Stand;Stand to Sit;Stand Pivot Transfers Sit to Stand: 4: Min guard;From bed;With upper extremity assist Stand to Sit: 4: Min guard;To chair/3-in-1;With armrests;With upper extremity assist Details for Transfer Assistance: provided HHA for rise to steady, verbal  cues for hand placement Ambulation/Gait Ambulation Distance (Feet): 14 Feet Assistive device: Other (Comment) Ambulation/Gait Assistance Details: pt used bed and IV pole to steady, declined using RW, slow gait speed, dyspnea 4/4 however SaO2 90% on 4L upon sitting in recliner Gait Pattern: Decreased stride length General Gait Details: 3-4 minutes to recover    Exercises     PT Diagnosis:    PT Problem List:   PT Treatment Interventions:     PT Goals (current goals can now be found in the care plan section)    Visit Information  Last PT Received On: 09/05/13 Assistance Needed: +1 History of Present Illness: 62 yo female transferred from [facility] with abdominal pain, pneumoperitoneum from perforated gastric ulcer.  She has hx of severe pulmonary fibrosis on chronic immunosuppression (followed at [facility]) and chronic hypoxic respiratory failure.  She was DNR and hospice care >> temporarily reversed for surgical intervention.  11/25 Exploratory laparotomy, closure perforated gastric ulcer, omental patch, abdominal wound closure with retention sutures; remained on vent post-op and extubated 11/30    Subjective Data      Cognition  Cognition Arousal/Alertness: Awake/alert Behavior During Therapy: Flat affect Overall Cognitive Status: Within Functional Limits for tasks assessed    Balance     End of Session PT - End of Session Equipment Utilized During Treatment: Oxygen Activity Tolerance: Patient limited by fatigue Patient left: in chair;with call bell/phone within reach Nurse Communication:  (pt request for throat lozanges)   GP     Korban Shearer,KATHrine E 09/05/2013, 1:59 PM Zenovia Jarred, PT, DPT 09/05/2013 Pager: [phone]

## 2013-09-05 NOTE — Progress Notes (Signed)
CSW notified RNCM that pt from home with hospice of [facility] and pt will return home with hospice upon medical stability from the hospital as pt does not currently qualify for Medicaid and pt unable to pay privately at [facility].  CSW discussed with MD who stated that pt will remain in hospital over the weekend.   CSW notified RNCM who will notify [facility] when pt medically ready for discharge.  No further social work needs identified at this time.  CSW signing off.   Please re-consult if further social work needs arise.   Jacklynn Lewis, MSW, LCSWA  Clinical Social Work [phone]

## 2013-09-06 LAB — BASIC METABOLIC PANEL
BUN: 4 mg/dL — ABNORMAL LOW (ref 6–23)
CO2: 32 mEq/L (ref 19–32)
Calcium: 8.8 mg/dL (ref 8.4–10.5)
Creatinine, Ser: 0.57 mg/dL (ref 0.50–1.10)
GFR calc non Af Amer: >90 mL/min (ref >90)
Glucose, Bld: 105 mg/dL — ABNORMAL HIGH (ref 70–99)

## 2013-09-06 LAB — CBC
HCT: 31.9 % — ABNORMAL LOW (ref 36.0–46.0)
Hemoglobin: 9.4 g/dL — ABNORMAL LOW (ref 12.0–15.0)
MCH: 28.8 pg (ref 26.0–34.0)
MCHC: 29.5 g/dL — ABNORMAL LOW (ref 30.0–36.0)
MCV: 97.9 fL (ref 78.0–100.0)
Platelets: 260 10*3/uL (ref 150–400)
RBC: 3.26 MIL/uL — ABNORMAL LOW (ref 3.87–5.11)
WBC: 6.8 10*3/uL (ref 4.0–10.5)

## 2013-09-06 MED ORDER — POTASSIUM CHLORIDE CRYS ER 20 MEQ PO TBCR
40.0000 meq | EXTENDED_RELEASE_TABLET | Freq: Two times a day (BID) | ORAL | Status: AC
  Administered 2013-09-06 (×2): 40 meq via ORAL
  Filled 2013-09-06 (×2): qty 2

## 2013-09-06 NOTE — ED Provider Notes (Signed)
Medical screening examination/treatment/procedure(s) were conducted as a shared visit with non-physician practitioner(s) and myself.  I personally evaluated the patient during the encounter.  EKG Interpretation    Date/Time:    Ventricular Rate:    PR Interval:    QRS Duration:   QT Interval:    QTC Calculation:   R Axis:     Text Interpretation:              Pt transferred from [place], accepted by Dr. Ezzard Standing. Has perforated viscus. Has several medical co morbidities. Hemodynamically stable  Derwood Kaplan, MD 09/06/13 [phone]

## 2013-09-06 NOTE — Progress Notes (Signed)
PULMONARY  / CRITICAL CARE MEDICINE  Name: Tanya Horne MRN: [id number] DOB: 07-06-1951    ADMISSION DATE:  08/26/2013   REFERRING MD :  EDP PRIMARY SERVICE: PCCM  CHIEF COMPLAINT:   Abdominal pain and found to have Intra-abdominal viscus perforation on CT scan  BRIEF PATIENT DESCRIPTION:  62 yo female transferred from [facility] with abdominal pain, pneumoperitoneum from perforated gastric ulcer.  She has hx of severe pulmonary fibrosis on chronic immunosuppression (followed at [facility]) and chronic hypoxic respiratory failure.  She was DNR and hospice care >> temporarily reversed for surgical intervention.  SIGNIFICANT EVENTS: 11/25 Exploratory laparotomy, closure perforated gastric ulcer, omental patch, abdominal wound closure with retention sutures; remained on vent post-op 11/26 Hb drop from 10.8 to 8.1 11/27 Hb drop to 7.4 12/02 Spoke with [facility] >> plan to change prednisone to 5 mg daily, and not resume cellcept 12/03 Transfer to floor 12/04 Blood in BM   STUDIES:  11/25 CT abd (@ [facility]) >> free intra-abdominal gas with apparent extravasation of enteral contrast from greater stomach curvature  LINES / TUBES: ET Tube 11/25 >>>11/30  CULTURES: 11/25 Abdomen Fluid Culture >>>  Few G(+) Rods 11/25 Abdomen Fluid Anearobic Culture >>> Few G(+) Rods  ANTIBIOTICS: Fluconazole 11/25 >>12/01 Zosyn 11/25 >>>11/28 11/28 unasyn>>>12/01  SUBJECTIVE:  Stable overnight.  No increased wob, and no further blood in BM's.    VITAL SIGNS: Temp:  [98 F (36.7 C)-98.7 F (37.1 C)] 98.2 F (36.8 C) (12/06 0645) Pulse Rate:  [100-119] 100 (12/06 0645) Resp:  [16-20] 20 (12/06 0645) BP: (120-122)/(71-80) 121/80 mmHg (12/06 0645) SpO2:  [98 %-100 %] 100 % (12/06 1020) Weight:  [50.6 kg (111 lb 8.8 oz)] 50.6 kg (111 lb 8.8 oz) (12/06 0645) 4 L [place]  INTAKE / OUTPUT: Intake/Output     12/05 0701 - 12/06 0700 12/06 0701 - 12/07 0700   P.O.     I.V. (mL/kg) 283 (5.6)    Total  Intake(mL/kg) 283 (5.6)    Urine (mL/kg/hr) 1100 (0.9)    Drains  45 (0.1)   Total Output 1100 45   Net -817 -45          PHYSICAL EXAMINATION: General: No distress, feels weak. Neuro: alert and oriented, moves all 4. HEENT: nose without purulence or d/c, no TMG or LN Cardiovascular: rrr, 2/6 sem Lungs: bilat crackles, no wheezing Abdomen: stable postop Ext: mild edema, no cyanonsis   LABS: CBC Recent Labs     09/04/13  1507  09/05/13  0450  09/06/13  0450  WBC  9.9  6.7  6.8  HGB  10.0*  10.2*  9.4*  HCT  33.3*  34.6*  31.9*  PLT  284  270  260   BMET Recent Labs     09/04/13  0454  09/05/13  0450  09/06/13  0450  NA  139  141  140  K  4.0  3.4*  3.2*  CL  101  102  102  CO2  32  32  32  BUN  3*  4*  4*  CREATININE  0.51  0.50  0.57  GLUCOSE  97  91  105*    Electrolytes Recent Labs     09/04/13  0454  09/05/13  0450  09/06/13  0450  CALCIUM  9.0  9.1  8.8    Glucose Recent Labs     09/04/13  0812  09/04/13  1223  09/04/13  1636  09/04/13  2122  09/05/13  0825  09/05/13  1219  GLUCAP  106*  117*  98  129*  91  133*    Imaging No results found.   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  On chronic immunosuppression as outpt (cellcept, prednisone). P:   -oxygen to keep SpO2 > 88% -changed to prednisone 5 mg daily on 12/04 -will not resume cellcept until re-evaluated as outpt with [facility] pulmonary; also will not need to resume dapsone as outpt    GASTROINTESTINAL A:  Perforated gastric ulcer s/p laparotomy. Dysphagia. Report of blood in BM 12/04 >> no further episodes since, and Hb stable. P:   -continue BID protonix -advance diet per [facility]CS -wound care per [facility], change dressing tid.

## 2013-09-07 NOTE — Progress Notes (Signed)
Patient ID: Tanya Horne, female   DOB: 09-02-1951, 62 y.o.   MRN: [id number] [facility] Progress Note:   12 Days Post-Op  Subjective: Mental status is alert Objective: Vital signs in last 24 hours: Temp:  [98.1 F (36.7 C)-98.5 F (36.9 C)] 98.1 F (36.7 C) (12/07 0628) Pulse Rate:  [99-111] 99 (12/07 0628) Resp:  [16-18] 16 (12/07 0628) BP: (130-134)/(67-68) 130/68 mmHg (12/07 0628) SpO2:  [100 %] 100 % (12/07 0628)  Intake/Output from previous day: 12/06 0701 - 12/07 0700 In: 840 [P.O.:840] Out: 1150 [Urine:1100; Drains:50] Intake/Output this shift:    Physical Exam: Work of breathing is baseline.  No complaints.  Taking liquids.    Lab Results:  Results for orders placed during the hospital encounter of 08/26/13 (from the past 48 hour(s))  GLUCOSE, CAPILLARY     Status: None   Collection Time    09/05/13  8:25 AM      Result Value Range   Glucose-Capillary 91  70 - 99 mg/dL  GLUCOSE, CAPILLARY     Status: Abnormal   Collection Time    09/05/13 12:19 PM      Result Value Range   Glucose-Capillary 133 (*) 70 - 99 mg/dL  CBC     Status: Abnormal   Collection Time    09/06/13  4:50 AM      Result Value Range   WBC 6.8  4.0 - 10.5 K/uL   RBC 3.26 (*) 3.87 - 5.11 MIL/uL   Hemoglobin 9.4 (*) 12.0 - 15.0 g/dL   HCT 40.9 (*) 81.1 - 91.4 %   MCV 97.9  78.0 - 100.0 fL   MCH 28.8  26.0 - 34.0 pg   MCHC 29.5 (*) 30.0 - 36.0 g/dL   RDW 78.2 (*) 95.6 - 21.3 %   Platelets 260  150 - 400 K/uL  BASIC METABOLIC PANEL     Status: Abnormal   Collection Time    09/06/13  4:50 AM      Result Value Range   Sodium 140  135 - 145 mEq/L   Potassium 3.2 (*) 3.5 - 5.1 mEq/L   Chloride 102  96 - 112 mEq/L   CO2 32  19 - 32 mEq/L   Glucose, Bld 105 (*) 70 - 99 mg/dL   BUN 4 (*) 6 - 23 mg/dL   Creatinine, Ser 0.86  0.50 - 1.10 mg/dL   Calcium 8.8  8.4 - 57.8 mg/dL   GFR calc non Af Amer >90  >90 mL/min   GFR calc Af Amer >90  >90 mL/min   Comment: (NOTE)     The  eGFR has been calculated using the CKD EPI equation.     This calculation has not been validated in all clinical situations.     eGFR's persistently <90 mL/min signify possible Chronic Kidney     Disease.    Radiology/Results: No results found.  Anti-infectives: Anti-infectives   Start     Dose/Rate Route Frequency Ordered Stop   08/29/13 1200  ampicillin-sulbactam (UNASYN) 1.5 g in sodium chloride 0.9 % 50 mL IVPB     1.5 g 100 mL/hr over 30 Minutes Intravenous Every 6 hours 08/29/13 1002 09/02/13 0542   08/26/13 1400  piperacillin-tazobactam (ZOSYN) IVPB 3.375 g  Status:  Discontinued     3.375 g 12.5 mL/hr over 240 Minutes Intravenous 3 times per day 08/26/13 0556 08/29/13 0959   08/26/13 0730  fluconazole (DIFLUCAN) IVPB 400 mg  Status:  Discontinued  400 mg 100 mL/hr over 120 Minutes Intravenous Daily before breakfast 08/26/13 0705 09/02/13 0813   08/26/13 0615  piperacillin-tazobactam (ZOSYN) IVPB 3.375 g     3.375 g 100 mL/hr over 30 Minutes Intravenous  Once 08/26/13 0603 08/26/13 1610      Assessment/Plan: Problem List: Patient Active Problem List   Diagnosis Date Noted  . Acute-on-chronic respiratory failure 08/26/2013  . Pre-operative respiratory examination 08/26/2013  . Chronic use of steroids 08/26/2013  . Adrenal insufficiency 08/26/2013  . Pulmonary fibrosis 08/26/2013  . Severe hypoxemia 08/26/2013  . Perforated gastric ulcer 08/26/2013    Doing fairly well.  Would leave JP in for now but advance diet as tolerated.   12 Days Post-Op    LOS: 12 days   Matt B. Daphine Deutscher, MD, [facility] [phone] beeper [phone]  09/07/2013 6:49 AM

## 2013-09-07 NOTE — Progress Notes (Signed)
PULMONARY  / CRITICAL CARE MEDICINE  Name: Tanya Horne MRN: [id number] DOB: [birth year]    ADMISSION DATE:  08/26/2013   REFERRING MD :  EDP PRIMARY SERVICE: PCCM  CHIEF COMPLAINT:   Abdominal pain and found to have Intra-abdominal viscus perforation on CT scan  BRIEF PATIENT DESCRIPTION:  62 yo female transferred from [facility] with abdominal pain, pneumoperitoneum from perforated gastric ulcer.  She has hx of severe pulmonary fibrosis on chronic immunosuppression (followed at [facility]) and chronic hypoxic respiratory failure.  She was DNR and hospice care >> temporarily reversed for surgical intervention.  SIGNIFICANT EVENTS: 11/25 Exploratory laparotomy, closure perforated gastric ulcer, omental patch, abdominal wound closure with retention sutures; remained on vent post-op 11/26 Hb drop from 10.8 to 8.1 11/27 Hb drop to 7.4 12/02 Spoke with [facility] >> plan to change prednisone to 5 mg daily, and not resume cellcept 12/03 Transfer to floor 12/04 Blood in BM   STUDIES:  11/25 CT abd (@ [facility]) >> free intra-abdominal gas with apparent extravasation of enteral contrast from greater stomach curvature  LINES / TUBES: ET Tube 11/25 >>>11/30  CULTURES: 11/25 Abdomen Fluid Culture >>>  Few G(+) Rods 11/25 Abdomen Fluid Anearobic Culture >>> Few G(+) Rods  ANTIBIOTICS: Fluconazole 11/25 >>12/01 Zosyn 11/25 >>>11/28 11/28 unasyn>>>12/01  SUBJECTIVE:  Stable overnight.  No increased wob, denies new complaints.    VITAL SIGNS: Temp:  [98.1 F (36.7 C)-99.3 F (37.4 C)] 99.3 F (37.4 C) (12/07 1350) Pulse Rate:  [99-109] 109 (12/07 1350) Resp:  [16-18] 18 (12/07 1350) BP: (119-130)/(66-68) 119/66 mmHg (12/07 1350) SpO2:  [99 %-100 %] 100 % (12/07 1350) Weight:  [52.9 kg (116 lb 10 oz)] 52.9 kg (116 lb 10 oz) (12/07 0900) 4 L [place]  INTAKE / OUTPUT: Intake/Output     12/06 0701 - 12/07 0700 12/07 0701 - 12/08 0700   P.O. 840 480   I.V. (mL/kg) 500 (9.9)    Total  Intake(mL/kg) 1340 (26.5) 480 (9.1)   Urine (mL/kg/hr) 1100 (0.9) 650 (1.6)   Drains 75 (0.1)    Total Output 1175 650   Net +165 -170        Stool Occurrence 2 x 1 x     PHYSICAL EXAMINATION: General: No distress, feels weak. Neuro: alert and oriented, moves all 4. HEENT: nose without purulence or d/c, no TMG or LN Cardiovascular: rrr, 2/6 sem Lungs: bilat crackles, no wheezing Abdomen: nontender currently, BS + Ext: mild edema, no cyanonsis   LABS: CBC Recent Labs     09/04/13  1507  09/05/13  0450  09/06/13  0450  WBC  9.9  6.7  6.8  HGB  10.0*  10.2*  9.4*  HCT  33.3*  34.6*  31.9*  PLT  284  270  260   BMET Recent Labs     09/05/13  0450  09/06/13  0450  NA  141  140  K  3.4*  3.2*  CL  102  102  CO2  32  32  BUN  4*  4*  CREATININE  0.50  0.57  GLUCOSE  91  105*    Electrolytes Recent Labs     09/05/13  0450  09/06/13  0450  CALCIUM  9.1  8.8    Glucose Recent Labs     09/04/13  1636  09/04/13  2122  09/05/13  0825  09/05/13  1219  GLUCAP  98  129*  91  133*    Imaging No  results found.   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  On chronic immunosuppression as outpt (cellcept, prednisone). P:   -oxygen to keep SpO2 > 88% -changed to prednisone 5 mg daily on 12/04 -will not resume cellcept until re-evaluated as outpt with [facility] pulmonary; also will not need to resume dapsone as outpt    GASTROINTESTINAL A:  Perforated gastric ulcer s/p laparotomy. Dysphagia. Report of blood in BM 12/04 >> no further episodes since, and Hb stable. P:   -continue BID protonix -advance diet per [facility] -wound care per [facility], change dressing tid.

## 2013-09-08 LAB — BASIC METABOLIC PANEL
BUN: 4 mg/dL — ABNORMAL LOW (ref 6–23)
Calcium: 9.1 mg/dL (ref 8.4–10.5)
Creatinine, Ser: 0.54 mg/dL (ref 0.50–1.10)
GFR calc Af Amer: >90 mL/min (ref >90)
GFR calc non Af Amer: >90 mL/min (ref >90)
Glucose, Bld: 102 mg/dL — ABNORMAL HIGH (ref 70–99)
Potassium: 3.7 mEq/L (ref 3.5–5.1)
Sodium: 139 mEq/L (ref 135–145)

## 2013-09-08 LAB — CBC
HCT: 33.4 % — ABNORMAL LOW (ref 36.0–46.0)
Hemoglobin: 10.1 g/dL — ABNORMAL LOW (ref 12.0–15.0)
MCH: 29.4 pg (ref 26.0–34.0)
MCHC: 30.2 g/dL (ref 30.0–36.0)
RDW: 19.3 % — ABNORMAL HIGH (ref 11.5–15.5)
WBC: 6.9 10*3/uL (ref 4.0–10.5)

## 2013-09-08 MED ORDER — BOOST PLUS PO LIQD: 237.0000 mL | Freq: Two times a day (BID) | ORAL | Status: AC

## 2013-09-08 MED ORDER — PANTOPRAZOLE SODIUM 40 MG PO TBEC: 40.0000 mg | DELAYED_RELEASE_TABLET | Freq: Two times a day (BID) | ORAL | Status: AC

## 2013-09-08 MED ORDER — PREDNISONE 5 MG PO TABS: 5.0000 mg | ORAL_TABLET | Freq: Every day | ORAL | Status: DC

## 2013-09-08 MED ORDER — BOOST PLUS PO LIQD
237.0000 mL | Freq: Two times a day (BID) | ORAL | Status: DC
  Administered 2013-09-08: 237 mL via ORAL
  Filled 2013-09-08 (×2): qty 237

## 2013-09-08 NOTE — Discharge Summary (Signed)
Physician Discharge Summary     Patient ID: Tanya Horne MRN: [id number] DOB/AGE: 1951-05-05 62 y.o.  Admit date: 08/26/2013 Discharge date: 09/08/2013  Discharge Diagnoses:  Principal Problem:   Perforated gastric ulcer Active Problems:   Acute-on-chronic respiratory failure   Pre-operative respiratory examination   Chronic use of steroids   Adrenal insufficiency   Pulmonary fibrosis   Severe hypoxemia   Detailed Horne Course:   62 year old female Admitted via ED with 24 hrs of abdominal pain and CT abdomen demonstrating free intra-peritoneal gas. She has hx of severe lung disease characterized as end stage pulm fibrosis, not otherwise specified despite surgical lung biopsy in March of 2013. She has been a Hospice patient for more than a year and has had a DNR order in place. At baseline, she wears 10 lpm Tanya Horne and ambulates a maximum of 15-20 feet. She resides at home with her son. Prior to onset of abdominal pain, she was in her usual state of poor health.  General surgery was consulted. Her code status was reversed for surgery. The the primary Horne course was as follows: 11/25 Exploratory laparotomy, closure perforated gastric ulcer, omental patch, abdominal wound closure with retention sutures; remained on vent post-op. Managed supportively post -op which included: IV antibiotics, sedation, wound care.She was successfully extubated on 11/30. 12/02 Spoke with Tanya Horne >> plan to change prednisone to 5 mg daily, and not resume cellcept. 12/03 Transfer to floor with hopes that she could go to SNF for rehab unfortunately she was self pay so could not afford this. She was monitored w/ support of PT/OT over the following days and deemed ready for d/c as of 12/8. Her d/c planning is as follows below.    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 On chronic immunosuppression as outpt (cellcept, prednisone).    P:  -oxygen to keep SpO2 > 88%  -changed to prednisone 5 mg daily on 12/04  -will not resume cellcept until re-evaluated as outpt with [facility] pulmonary; also will not need to resume dapsone as outpt  Perforated gastric ulcer with localized peritonitis  S/p Exploratory laparotomy, closure perforated gastric ulcer, omental patch, abdominal wound closure with retention sutures, 08/26/2013, Tanya Mention, MD  POD 13 ( she has an open wound with retention sutures) P:  -continue BID protonix  -advance diet per [facility]  -wound care per [facility] saline wet to dry bid  - record output  - Tanya Horne surg will make f/u   Significant Horne tests/ studies/ interventions and procedures  STUDIES:  11/25 CT abd (@ Tanya Horne) >> free intra-abdominal gas with apparent extravasation of enteral contrast from greater stomach curvature   LINES / TUBES:  ET Tube 11/25 >>>11/30   CULTURES:  11/25 Abdomen Fluid Culture >>> Few G(+) Rods  11/25 Abdomen Fluid Anearobic Culture >>> Few G(+) Rods   ANTIBIOTICS:  Fluconazole 11/25 >>12/01  Zosyn 11/25 >>>11/28  11/28 unasyn>>>12/01  Discharge Exam: BP 125/66  Pulse 98  Temp(Src) 98.3 F (36.8 C) (Oral)  Resp 20  Ht 4\' 11"  (1.499 m)  Wt 49.1 kg (108 lb 3.9 oz)  BMI 21.85 kg/m2  SpO2 100% 4 liters  PHYSICAL EXAMINATION:  General: No distress, feels weak.  Neuro: alert and oriented, moves all 4.  HEENT: nose without purulence or d/c, no TMG or LN  Cardiovascular: rrr, 2/6 sem  Lungs: bilat crackles, no wheezing  Abdomen: nontender currently, BS +  Ext: mild edema, no cyanonsis   Labs at discharge Lab Results  Component Value Date   CREATININE 0.54 09/08/2013   BUN 4* 09/08/2013   NA 139 09/08/2013   K 3.7 09/08/2013   CL 101 09/08/2013   CO2 29 09/08/2013   Lab Results  Component Value Date   WBC 6.9 09/08/2013   HGB 10.1* 09/08/2013   HCT 33.4* 09/08/2013   MCV 97.1 09/08/2013   PLT 342 09/08/2013   Lab Results  Component Value Date   ALT 24  08/26/2013   AST 22 08/26/2013   ALKPHOS 71 08/26/2013   BILITOT 1.2 08/26/2013   Lab Results  Component Value Date   INR 1.03 08/26/2013    Current radiology studies No results found.  Disposition:  Final discharge disposition not confirmed      Discharge Orders   Future Appointments Provider Department Dept Phone   09/17/2013 3:45 PM Tanya Mention, MD [facility] [place] [phone]   Future Orders Complete By Expires   Diet - low sodium heart healthy  As directed    Increase activity slowly  As directed        Medication List    STOP taking these medications       dapsone 100 MG tablet     furosemide 20 MG tablet  Commonly known as:  LASIX     mycophenolate 500 MG tablet  Commonly known as:  CELLCEPT     naproxen sodium 220 MG tablet  Commonly known as:  ANAPROX      TAKE these medications       albuterol (2.5 MG/3ML) 0.083% nebulizer solution  Commonly known as:  PROVENTIL  Take 2.5 mg by nebulization every 6 (six) hours as needed for wheezing or shortness of breath.     lactose free nutrition Liqd  Take 237 mLs by mouth 2 (two) times daily between meals.     pantoprazole 40 MG tablet  Commonly known as:  PROTONIX  Take 1 tablet (40 mg total) by mouth 2 (two) times daily before a meal.     predniSONE 5 MG tablet  Commonly known as:  DELTASONE  Take 1 tablet (5 mg total) by mouth daily with breakfast.       Follow-up Information   Follow up with Tanya Mention, MD On 09/17/2013. (Be at our office at 3:30 for a 3:45 PM appointment.)    Specialty:  General Surgery   Contact information:   [address] [place] [address] [phone]       Discharged Condition: fair    Signed: BABCOCK,PETE 09/08/2013, 1:15 PM  Physician Statement:   The Patient was personally examined, the discharge assessment and plan has been personally reviewed and I agree with Tanya Horne's assessment and plan. > 30 minutes of time  have been dedicated to discharge assessment, planning and discharge instructions.    Tanya V.

## 2013-09-08 NOTE — Progress Notes (Signed)
PT Cancellation Note  ___Treatment cancelled today due to medical issues with patient which prohibited therapy  ___ Treatment cancelled today due to patient receiving procedure or test   ___ Treatment cancelled today due to patient's refusal to participate   _X_ Treatment cancelled today due to pt's declined stating she was up earlier and not getting up again.  "I'm going home tomorrow"  Felecia Shelling  PTA [facility]  Acute  Rehab Pager      [phone]

## 2013-09-08 NOTE — Progress Notes (Signed)
Pt discharged home via family; Pt and family given and explained all discharge instructions, carenotes, and prescriptions; pt and family stated understanding and denied questions/concerns; all f/u appointments in place; IV removed without complicaitons; pt stable at time of discharge   Pt being transferred via [facility]; report provided to them; pt son is aware of discharge and states he is at home waiting for the patient. Was able to demostrate dressing changes this am to son and how to empty the JP drain, both stated understanding and denied questions/concerns; pt stable at time of discharge

## 2013-09-08 NOTE — Progress Notes (Signed)
PULMONARY  / CRITICAL CARE MEDICINE  Name: Tanya Horne MRN: [id number] DOB: 01-Mar-1951    ADMISSION DATE:  08/26/2013   REFERRING MD :  EDP PRIMARY SERVICE: PCCM  CHIEF COMPLAINT:   Abdominal pain and found to have Intra-abdominal viscus perforation on CT scan  BRIEF PATIENT DESCRIPTION:  62 yo female transferred from [facility] with abdominal pain, pneumoperitoneum from perforated gastric ulcer.  She has hx of severe pulmonary fibrosis on chronic immunosuppression (followed at [facility]) and chronic hypoxic respiratory failure.  She was DNR and hospice care >> temporarily reversed for surgical intervention.  SIGNIFICANT EVENTS: 11/25 Exploratory laparotomy, closure perforated gastric ulcer, omental patch, abdominal wound closure with retention sutures; remained on vent post-op 11/26 Hb drop from 10.8 to 8.1 11/27 Hb drop to 7.4 12/02 Spoke with [facility] >> plan to change prednisone to 5 mg daily, and not resume cellcept 12/03 Transfer to floor 12/04 Blood in BM   STUDIES:  11/25 CT abd (@ [facility]) >> free intra-abdominal gas with apparent extravasation of enteral contrast from greater stomach curvature  LINES / TUBES: ET Tube 11/25 >>>11/30  CULTURES: 11/25 Abdomen Fluid Culture >>>  Few G(+) Rods 11/25 Abdomen Fluid Anearobic Culture >>> Few G(+) Rods  ANTIBIOTICS: Fluconazole 11/25 >>12/01 Zosyn 11/25 >>>11/28 11/28 unasyn>>>12/01  SUBJECTIVE:  Afebrile Taking po    VITAL SIGNS: Temp:  [98 F (36.7 C)-98.7 F (37.1 C)] 98 F (36.7 C) (12/08 1410) Pulse Rate:  [98-116] 116 (12/08 1410) Resp:  [18-21] 18 (12/08 1410) BP: (124-129)/(66-82) 124/82 mmHg (12/08 1410) SpO2:  [98 %-100 %] 100 % (12/08 1410) FiO2 (%):  [36 %] 36 % (12/07 1958) Weight:  [108 lb 3.9 oz (49.1 kg)] 108 lb 3.9 oz (49.1 kg) (12/08 0703) 4 L [place]  INTAKE / OUTPUT: Intake/Output     12/07 0701 - 12/08 0700 12/08 0701 - 12/09 0700   P.O. 40981 480   I.V. (mL/kg) 300 (5.7)    Total  Intake(mL/kg) 25335 (478.9) 480 (9.8)   Urine (mL/kg/hr) 2150 (1.7) 1100 (2.4)   Drains 15 (0) 10 (0)   Total Output 2165 1110   Net +23170 -630        Stool Occurrence 2 x 2 x     PHYSICAL EXAMINATION: General: No distress, feels weak. Neuro: alert and oriented, moves all 4. HEENT: nose without purulence or d/c, no TMG or LN Cardiovascular: rrr, 2/6 sem Lungs: bilat crackles, no wheezing Abdomen: nontender currently, BS + Ext: mild edema, no cyanonsis   LABS: CBC Recent Labs     09/06/13  0450  09/08/13  0413  WBC  6.8  6.9  HGB  9.4*  10.1*  HCT  31.9*  33.4*  PLT  260  342   BMET Recent Labs     09/06/13  0450  09/08/13  0413  NA  140  139  K  3.2*  3.7  CL  102  101  CO2  32  29  BUN  4*  4*  CREATININE  0.57  0.54  GLUCOSE  105*  102*    Electrolytes Recent Labs     09/06/13  0450  09/08/13  0413  CALCIUM  8.8  9.1    Glucose No results found for this basename: GLUCAP,  in the last 72 hours  Imaging No results found.   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  On chronic immunosuppression as  outpt (cellcept, prednisone). P:   -oxygen to keep SpO2 > 88% -changed to prednisone 5 mg daily on 12/04 -will not resume cellcept until re-evaluated as outpt with [facility] pulmonary; also will not need to resume dapsone as outpt    GASTROINTESTINAL A:  Perforated gastric ulcer s/p laparotomy. Dysphagia. Report of blood in BM 12/04 >> no further episodes since, and Hb stable. P:   -continue BID protonix -advance diet per [facility] -wound care per [facility], change dressing tid.  Plan for dc back to home hospice  [facility].

## 2013-09-08 NOTE — Progress Notes (Signed)
Patient interviewed and examined, agree with PA note above.  Mariella Saa MD, FACS  09/08/2013 3:41 PM

## 2013-09-08 NOTE — Care Management Note (Signed)
   CARE MANAGEMENT NOTE 09/08/2013  Patient:  Tanya Horne, Tanya Horne   Account Number:  [id number]  Date Initiated:  08/26/2013  Documentation initiated by:  Dakisha Schoof,RHONDA  Subjective/Objective Assessment:   pt with end stage pulmonary fibrosis and now with gastric perforation requiring surgical intervention.  On vent for full resp support s/p surg.     Action/Plan:   patient is a patient of [facility]. RN is Renea Ee at [phone] or the main office at [phone]   Anticipated DC Date:  09/04/2013   Anticipated DC Plan:  HOME W HOSPICE CARE  In-house referral  Clinical Social Worker      DC Planning Services  CM consult      PAC Choice  HOSPICE   Choice offered to / List presented to:  NA   DME arranged  NA      DME agency  [facility]     HH arranged  NA      HH agency  [facility]   Status of service:  In process, will continue to follow Medicare Important Message given?  NA - LOS <3 / Initial given by admissions (If response is "NO", the following Medicare IM given date fields will be blank) Date Medicare IM given:   Date Additional Medicare IM given:    Discharge Disposition:    Per UR Regulation:  Reviewed for med. necessity/level of care/duration of stay  If discussed at Long Length of Stay Meetings, dates discussed:   09/02/2013    Comments:  09/08/13 1314 Shelina Luo,MSN,RN [phone] Cm notified [facility] of pt's discharge. DC Summary and resumption of care orders faxed to [phone]. pt to discharge home with adult son assisting in home care. No other needs identified.  [id number] Earlene Plater, RN, BSN, [place] [phone] Chart Reviewed for discharge and hospital needs. Discharge needs at time of review:  None present will follow for needs. Patient extubated on 11302014/ on 10 liter o2 via [place]. Review of patient progress due on [id number].   [id number] Earlene Plater RN, BSN, [place] [phone] Chart  Reviewed for discharge and hospital needs. Discharge needs at time of review:  None present will follow for needs. pod#3 and vent day#3 Review of patient progress due on [id number].   [id number] Earlene Plater, RN, BSN, CCN: [phone] Case management. Chart reviewed for discharge planning and present needs. Discharge needs: none present at time of review.  PLEASE SEE ABOVE NOTE FOR HOSPICE CONTACTS. Next chart review due:  [id number]

## 2013-09-08 NOTE — Progress Notes (Signed)
13 Days Post-Op  Subjective: She looks great.  Eating and drinking regular diet.   Off antibiotics since 09/01/13 She still has a drain in and retention sutures (POD 13)  SHE CAME in in steroids and Cellcept for her COPD.   Objective: Vital signs in last 24 hours: Temp:  [98.3 F (36.8 C)-99.3 F (37.4 C)] 98.3 F (36.8 C) (12/08 0552) Pulse Rate:  [98-113] 98 (12/08 0552) Resp:  [18-21] 20 (12/08 0552) BP: (119-129)/(66-73) 125/66 mmHg (12/08 0552) SpO2:  [98 %-100 %] 100 % (12/08 0840) FiO2 (%):  [36 %] 36 % (12/07 1958) Weight:  [49.1 kg (108 lb 3.9 oz)] 49.1 kg (108 lb 3.9 oz) (12/08 0703) Last BM Date: 09/08/13 Intake? Afebrile, Tachycardic  Labs ok Low fiber diet Intake/Output from previous day: 12/07 0701 - 12/08 0700 In: 16109 [P.O.:25035; I.V.:300] Out: 2165 [Urine:2150; Drains:15] Intake/Output this shift: Total I/O In: -  Out: 300 [Urine:300]  General appearance: alert, cooperative and no distress Resp: clear to auscultation bilaterally GI: soft, open sites look good, drain is minimal and clear.  +BS and BM  Lab Results:   Recent Labs  09/06/13 0450 09/08/13 0413  WBC 6.8 6.9  HGB 9.4* 10.1*  HCT 31.9* 33.4*  PLT 260 342    BMET  Recent Labs  09/06/13 0450 09/08/13 0413  NA 140 139  K 3.2* 3.7  CL 102 101  CO2 32 29  GLUCOSE 105* 102*  BUN 4* 4*  CREATININE 0.57 0.54  CALCIUM 8.8 9.1   PT/INR No results found for this basename: LABPROT, INR,  in the last 72 hours  No results found for this basename: AST, ALT, ALKPHOS, BILITOT, PROT, ALBUMIN,  in the last 168 hours   Lipase     Component Value Date/Time   LIPASE 46 08/26/2013 0505     Studies/Results: No results found.  Medications: . albuterol  2.5 mg Nebulization Q6H WA  . antiseptic oral rinse  15 mL Mouth Rinse QID  . chlorhexidine  15 mL Mouth Rinse BID  . enoxaparin (LOVENOX) injection  40 mg Subcutaneous Q24H  . feeding supplement (ENSURE COMPLETE)  237 mL Oral BID BM   . pantoprazole  40 mg Oral BID AC  . predniSONE  5 mg Oral Q breakfast    Assessment/Plan Perforated gastric ulcer with localized peritonitis  S/p Exploratory laparotomy, closure perforated gastric ulcer, omental patch, abdominal wound closure with retention sutures, 08/26/2013, Tanya Mention, MD  POD 13 ( she has an open wound with retention sutures) Severe Pulmonary Fibrosis with End stage pulmonary disease on 10 liters per [place] pre op  Chronic steroid dependence/Immunosupression  Adrenal insuffiencey  Acute on chronic anemia   Plan:  I will check on retention sutures and drain.  If she goes home before those are pulled she can follow up with Dr. Derrell Lolling next week.  I will check with Dr. Johna Sheriff who is covering today.       LOS: 13 days    Tanya Horne 09/08/2013

## 2013-09-08 NOTE — Progress Notes (Signed)
NUTRITION FOLLOW UP  Intervention:   - Boost Plus BID - Pt eating well, denies any nutrition concerns, nutrition signing off  Nutrition Dx:   Inadequate oral intake related to inability to eat as evidenced by pt on clear liquid diet with NGT in place - resolved, diet advanced to low fiber, pt eating well    Goal:   Advance diet as tolerated to regular diet - met   Assessment:   Admitted from [facility] with c/o of abdominal pain. Found to have pneumoperitoneum with extravasation along the greater curvature on CT of abdomen per MD notes. Has been on hospice since July 2013 for end stage pulmonary fibrosis. S/p exploratory laparotomy, closure perforated gastric ulcer, omental patch, abdominal wound closure with retention sutures 11/25.   11/26 - Pt able to shake head during visit. Was able to gather she lost 10 pounds unintentionally in the past 6 months and her appetite was "so-so" PTA, then pt started to fall asleep.   11/29 - Trickle TF started via NGT of Vital AF 1.2 at 10ml/hr  11/30 - Plan was for possible panda tube placement 11/30. Pt extubated, TF stopped. NGT clamped.   12/1 - Getting sham feeding - clear liquid diet with NGT in place.   12/3 - NGT d/c. Had bedside SLP evaluation and noted to be at moderate aspiration risk with recommendations for thin liquid diet.   12/4 - Had bright red bowel movement. Diet advanced to full liquids  12/5 - Diet advanced to low fiber  12/8 - Pt discussed during multidisciplinary rounds. Met with pt who reports eating >50% of meals. Interested in getting Boost.   Height: Ht Readings from Last 1 Encounters:  09/03/13 4\' 11"  (1.499 m)    Weight Status:   Wt Readings from Last 1 Encounters:  09/08/13 108 lb 3.9 oz (49.1 kg)  Admit wt:        133 lb 9.6 oz (60.6 kg)  Net I/Os: +21.5L  Re-estimated needs:  Kcal: 1350-1500 Protein: 80-95g Fluid: 1.2-1.4L/day  Skin: generalized edema, Stage 2 sacral pressure  ulcer, abdominal incision  Diet Order: Fiber Restricted   Intake/Output Summary (Last 24 hours) at 09/08/13 1127 Last data filed at 09/08/13 8469  Gross per 24 hour  Intake  62952 ml  Output   2315 ml  Net  22780 ml    Last BM: 12/8   Labs:   Recent Labs Lab 09/03/13 0330  09/05/13 0450 09/06/13 0450 09/08/13 0413  NA 141  < > 141 140 139  K 3.1*  < > 3.4* 3.2* 3.7  CL 101  < > 102 102 101  CO2 35*  < > 32 32 29  BUN 4*  < > 4* 4* 4*  CREATININE 0.48*  < > 0.50 0.57 0.54  CALCIUM 8.8  < > 9.1 8.8 9.1  MG 1.9  --   --   --   --   GLUCOSE 103*  < > 91 105* 102*  < > = values in this interval not displayed.  CBG (last 3)   Recent Labs  09/05/13 1219  GLUCAP 133*    Scheduled Meds: . albuterol  2.5 mg Nebulization Q6H WA  . antiseptic oral rinse  15 mL Mouth Rinse QID  . chlorhexidine  15 mL Mouth Rinse BID  . enoxaparin (LOVENOX) injection  40 mg Subcutaneous Q24H  . feeding supplement (ENSURE COMPLETE)  237 mL Oral BID BM  . pantoprazole  40 mg Oral  BID AC  . predniSONE  5 mg Oral Q breakfast       Levon Hedger MS, RD, LDN [phone] Pager [phone] After Hours Pager

## 2013-09-15 ENCOUNTER — Encounter (INDEPENDENT_AMBULATORY_CARE_PROVIDER_SITE_OTHER): Payer: Self-pay | Admitting: Surgery

## 2013-09-15 ENCOUNTER — Ambulatory Visit (INDEPENDENT_AMBULATORY_CARE_PROVIDER_SITE_OTHER): Payer: Self-pay | Admitting: Surgery

## 2013-09-15 VITALS — BP 138/88 | HR 125 | Temp 99.5°F | Resp 18

## 2013-09-15 DIAGNOSIS — Z09 Encounter for follow-up examination after completed treatment for conditions other than malignant neoplasm: Secondary | ICD-10-CM

## 2013-09-15 NOTE — Progress Notes (Signed)
Subjective:     Patient ID: Tanya Horne, female   DOB: Sep 21, 1951, 62 y.o.   MRN: [id number]  HPI She is here for her first postoperative visit status post emergent exploratory laparotomy for perforated ulcer performed by Dr. Derrell Lolling on November 25. She has retention sutures and a drain still in place. She is here today because home health was concerned about some odor from the wound. She is tolerating a diet and having bowel movements. She complains of significant pain from the middle retention suture. The drain is draining minimal fluid  Review of Systems     Objective:   Physical Exam On exam, the fascia remained intact. The wound is fairly clean. I removed her drain and her central retention suture leaving the other 2 and placed    Assessment:     Patient stable postop     Plan:     She will come back and see Dr. Derrell Lolling in approximately 2 weeks.  She will continue to refrain from any heavy lifting

## 2013-09-17 ENCOUNTER — Encounter (INDEPENDENT_AMBULATORY_CARE_PROVIDER_SITE_OTHER): Payer: Self-pay | Admitting: General Surgery

## 2013-09-30 ENCOUNTER — Encounter (INDEPENDENT_AMBULATORY_CARE_PROVIDER_SITE_OTHER): Payer: Self-pay | Admitting: General Surgery

## 2013-09-30 ENCOUNTER — Ambulatory Visit (INDEPENDENT_AMBULATORY_CARE_PROVIDER_SITE_OTHER): Payer: Self-pay | Admitting: General Surgery

## 2013-09-30 VITALS — BP 152/90 | HR 136 | Temp 98.5°F | Resp 30 | Ht 59.0 in | Wt 119.4 lb

## 2013-09-30 DIAGNOSIS — J8410 Pulmonary fibrosis, unspecified: Secondary | ICD-10-CM

## 2013-09-30 DIAGNOSIS — IMO0002 Reserved for concepts with insufficient information to code with codable children: Secondary | ICD-10-CM

## 2013-09-30 DIAGNOSIS — K255 Chronic or unspecified gastric ulcer with perforation: Secondary | ICD-10-CM

## 2013-09-30 NOTE — Patient Instructions (Signed)
You seemed to be doing reasonably well following your abdominal operation for a perforated gastric ulcer.  The abdominal wound appears to be healing reasonably well. I removed the large retention sutures today.  Continue to change the bandage twice a day. He may bathe as you wish. I would expect that the wound will completely heal and not require bandage in 4 weeks or less.  Return to see Dr. Derrell Lolling if the wound has not completely healed in 4 weeks.  I have told you that I think the ulcer in your stomach occurred because of the high dose steroids. I have told you that I think that it is much less likely that this was due to cancer.  We discussed followup endoscopy, which is the standard of care here to be sure that there is not a gastric cancer. You have stated that you would like to do that. You will be referred to a gastroenterologist for upper endoscopy to ensure that the gastric ulcer has healed and to be sure that there is no malignant mass.

## 2013-09-30 NOTE — Progress Notes (Signed)
Patient ID: Tanya Horne, female   DOB: 10-16-1950, 62 y.o.   MRN: [id number]  History: Patient is here with her family for her second postoperative visit status post emergent expiratory laparotomy for perforated gastric  ulcer on November 25. I performed a laparotomy, closure and omental patch of a perforated ulcer on the greater curvature of the stomach, proximal antrum.  This was presumably due to her high dose steroids. There was no mass or evidence of cancer. She remained on the ventilator for 3 or 4 days because of her oxygen-dependent pulmonary fibrosis but remarkably  did recover. She is now living at home with her family. Remains mostly wheelchair-bound with oxygen but is alert and awake. She states that she is tolerating a regular diet having daily bowel movements and doesn't have any significant pain other than from the retention sutures.   Exam: Patient is alert and stable. Somewhat increased work of breathing which is baseline. Abdomen is soft and nontender. 2 retention sutures remain and these were removed. There is some healthy granulation tissue. No hernia. The wound appears intact.  Assessment:  stable following laparotomy and closure of perforated gastric ulcer. My impression from the gross findings at surgery is that this was a benign perforation from steroids. Severe oxygen-dependent pulmonary fibrosis, steroid dependent, on lower dose prednisone now.  Plan: Wound care was discussed. Cleansing with mild soap and water and covering with dry gauze bandage once or twice daily was recommended. I offered to see her in one month or 2, or...to see me as needed if this does not completely heal in one month. It was their  preference to see me when necessary. I told them that standard of care here was followup endoscopy approximately 8 weeks postop to rule out cancer and to ensure healing of the ulcer. She is very much in favor of that. She is aware that she is a poor surgical risk,  however. They requested that I referred to gastroenterology here in town, and we have made at ambulatory referral I will see her as needed in the future.   Angelia Mould. Derrell Lolling, M.D., [facility] General and Minimally invasive Surgery Breast and Colorectal Surgery Office:   [phone] Pager:   [phone]

## 2013-10-01 ENCOUNTER — Other Ambulatory Visit (INDEPENDENT_AMBULATORY_CARE_PROVIDER_SITE_OTHER): Payer: Self-pay

## 2013-10-01 DIAGNOSIS — K255 Chronic or unspecified gastric ulcer with perforation: Secondary | ICD-10-CM

## 2013-11-10 ENCOUNTER — Telehealth (INDEPENDENT_AMBULATORY_CARE_PROVIDER_SITE_OTHER): Payer: Self-pay | Admitting: *Deleted

## 2013-11-10 NOTE — Telephone Encounter (Signed)
LMOM for pt to return my call.  [facility] has attempted to call pt about referral and getting her scheduled.  I was calling to make her aware that they have called and for her to call them back to get scheduled at [phone].

## 2013-11-13 NOTE — Telephone Encounter (Signed)
Patient has not returned any calls at this point.  Will forward this to Dr. Dalbert Batman to make him aware.

## 2013-11-13 NOTE — Telephone Encounter (Signed)
Noted. She was discharged from my care at the last office visit. We'll be happy to see again upon referral for specific problem.  hmi

## 2013-11-17 ENCOUNTER — Encounter: Payer: Self-pay | Admitting: Gastroenterology

## 2013-12-17 ENCOUNTER — Ambulatory Visit: Payer: Self-pay | Admitting: Gastroenterology

## 2015-01-09 IMAGING — CR DG CHEST 1V PORT
1 series · 1 of 1 positions shown · non-contrast
Comparison: None.

CLINICAL DATA: Dyspnea, history of pulmonary fibrosis, unexplained
pneumoperitoneum. Preoperative study for abdominal surgery.

EXAM:
PORTABLE CHEST - 1 VIEW

[AP]
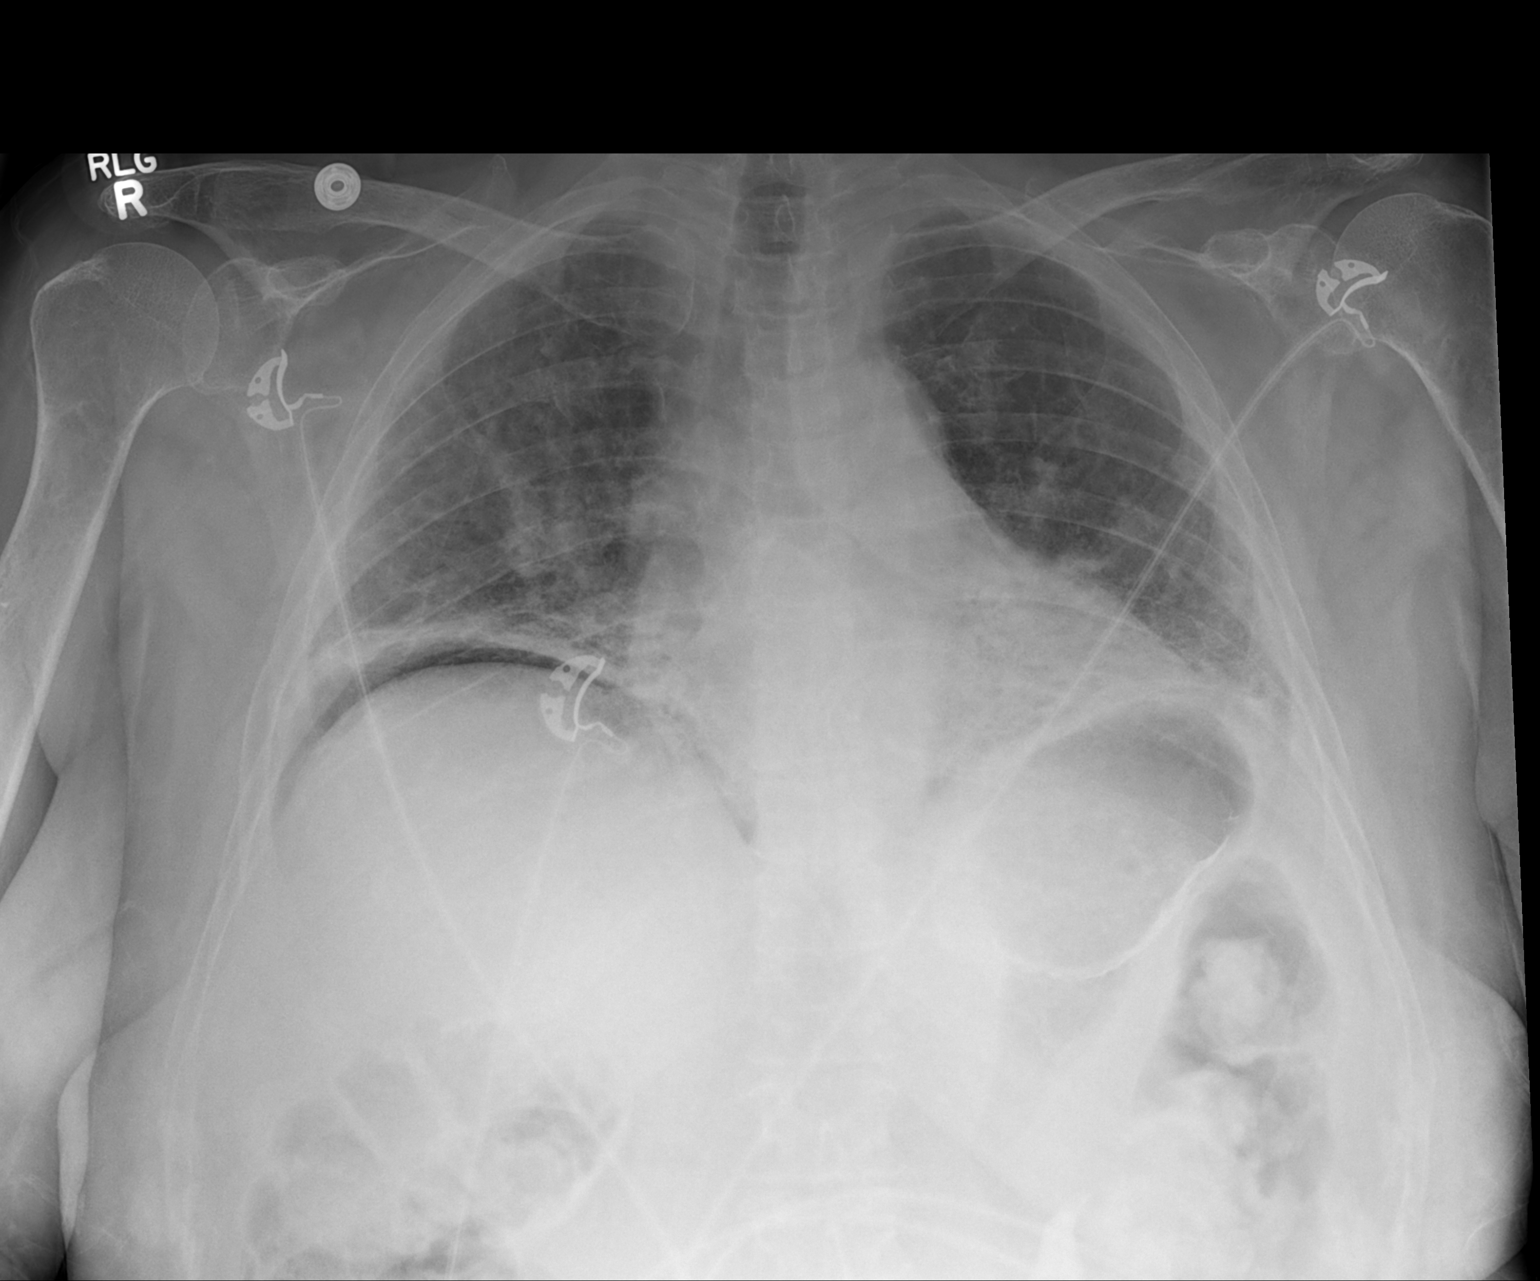

[1 of 1 positions shown; findings below may reference images not displayed]

FINDINGS: There is linear density at the right lung base just above the
hemidiaphragm. This is most compatible with atelectasis.
Hyperlucency however under this density is consistent with free
extraluminal gas. By history, the patient has pneumoperitoneum of
uncertain etiology. Both lungs are hypoinflated. There is increased
density at the left lung base consistent with atelectasis. The
cardiac silhouette is top-normal in size. The central pulmonary
vascularity is prominent but this is in part due to crowding
secondary to hypoinflation. The trachea is midline Within the upper
abdomen there is gas within normal appearing loops of hepatic and
splenic flexure of the colon and there is gas within the stomach.
IMPRESSION: 1. There is density at the right lung base with lucency just beneath
it consistent with pneumoperitoneum and adjacent atelectasis or
fibrosis.
2. There is bilateral pulmonary hypo inflation. Increased
interstitial markings in both lungs may reflect known pulmonary
fibrosis, but one cannot absolutely exclude low-grade CHF in the
appropriate clinical setting.

## 2015-01-09 IMAGING — CT CT ABD-PELV W/ CM
2 of 5 series · 16 of 46 positions shown, 18 images · IV contrast (isovue)
Comparison: None.

CLINICAL DATA: Diffuse abdominal pain and tenderness. Leukocytosis.
Previous appendectomy and cesarean section.

EXAM:
CT ABDOMEN AND PELVIS WITH CONTRAST
TECHNIQUE: Multidetector CT imaging of the abdomen and pelvis was performed
using the standard protocol following bolus administration of
intravenous contrast.
CONTRAST:  100 cc Isovue 300

[Series 2: routine abd pel with · axial · 0.68mm/px · z∈[-344,+42]mm · 13 of 87 slices shown, 15 images]
[im 5/87  soft-tissue]
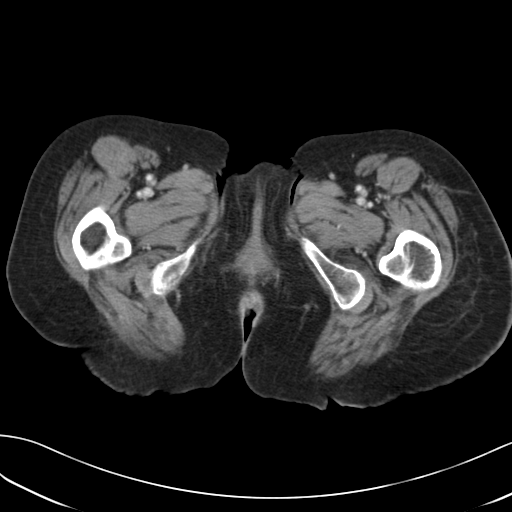
[im 5/87  bone]
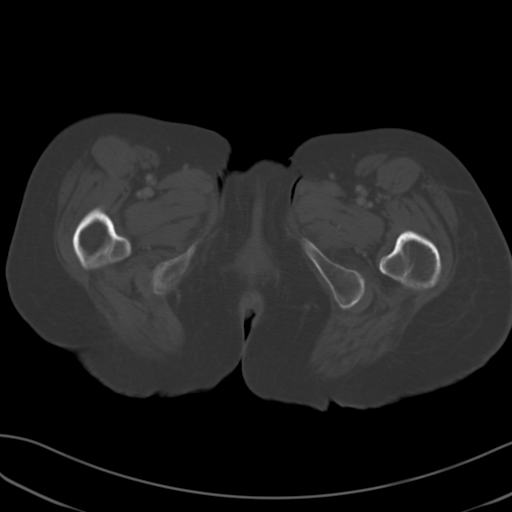
[im 14/87  soft-tissue]
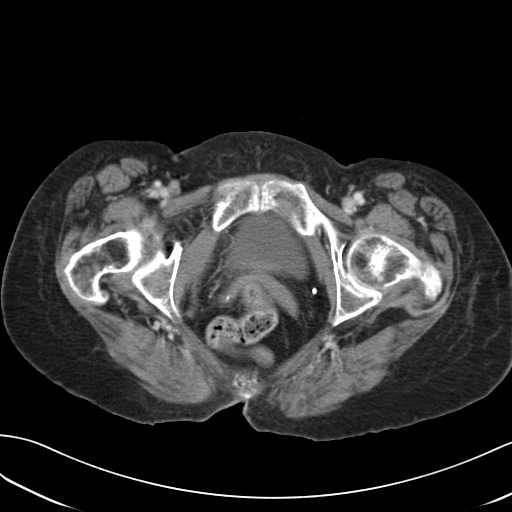
[im 19/87  soft-tissue]
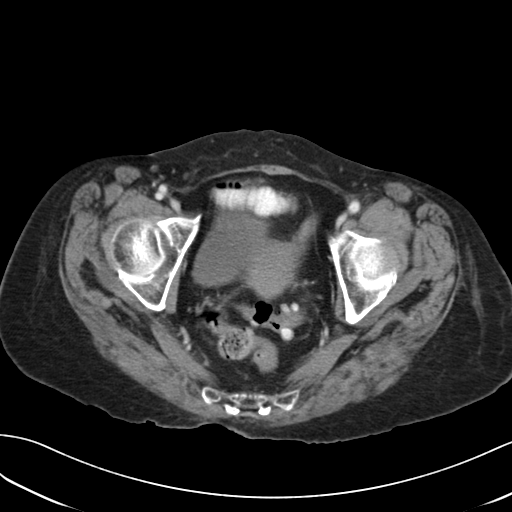
[im 23/87  soft-tissue]
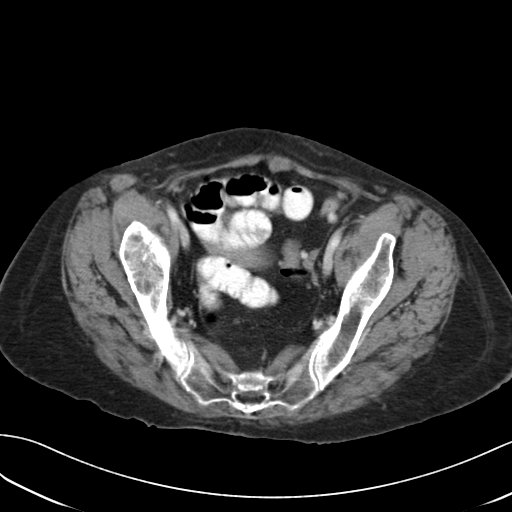
[im 32/87  soft-tissue]
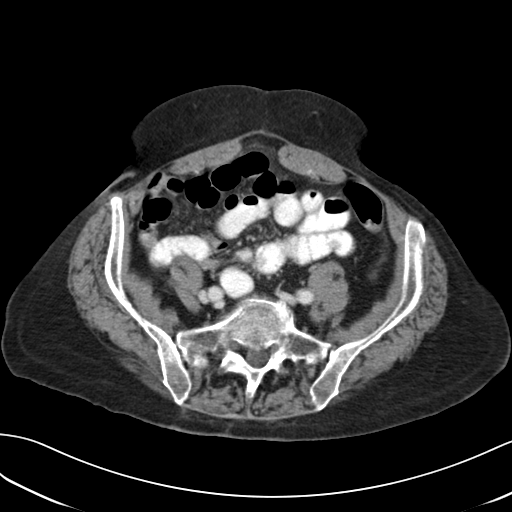
[im 37/87  soft-tissue]
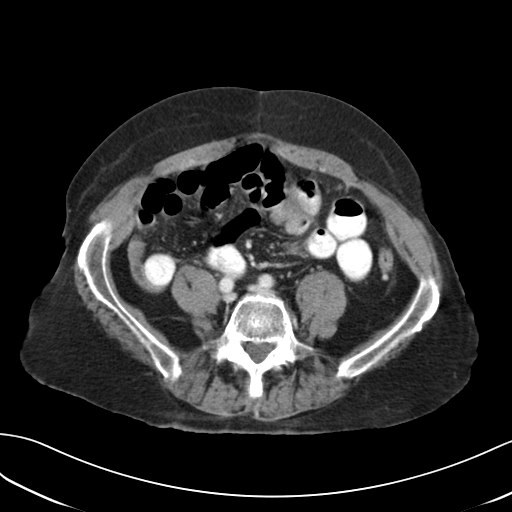
[im 46/87  soft-tissue]
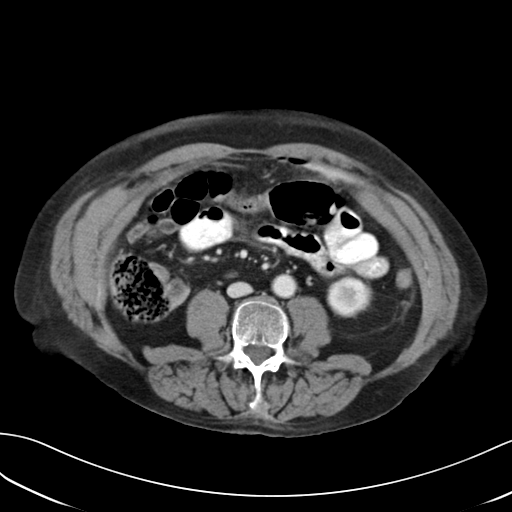
[im 50/87  soft-tissue]
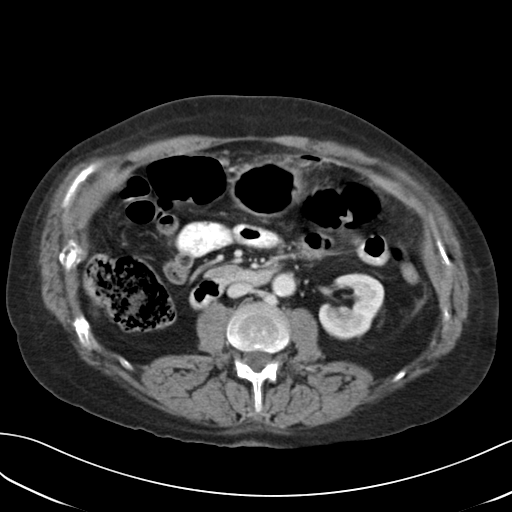
[im 55/87  soft-tissue]
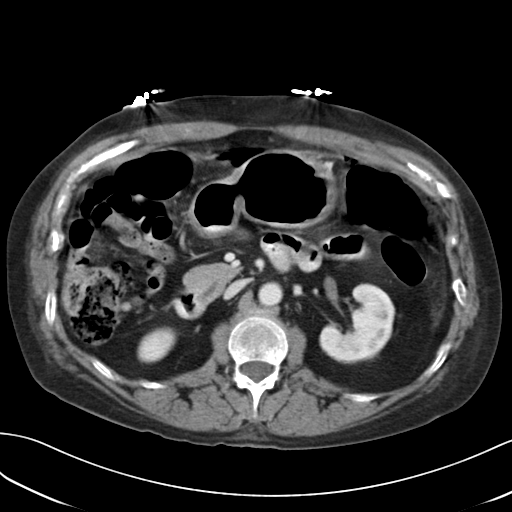
[im 55/87  bone]
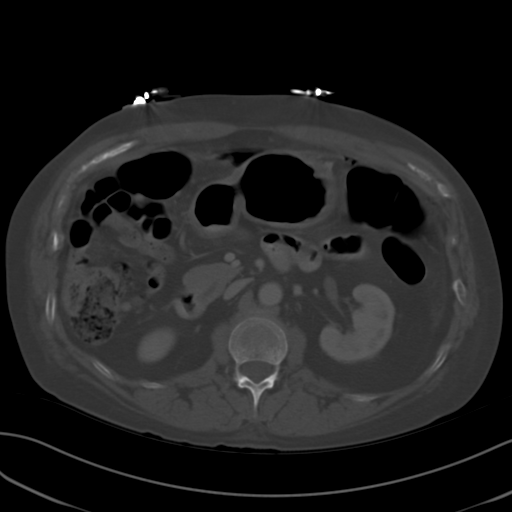
[im 64/87  soft-tissue]
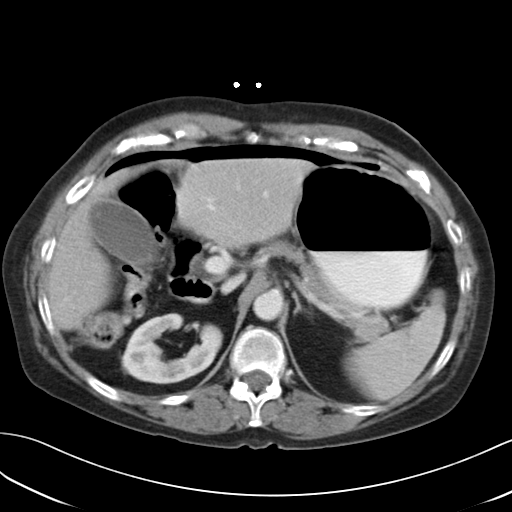
[im 68/87  soft-tissue]
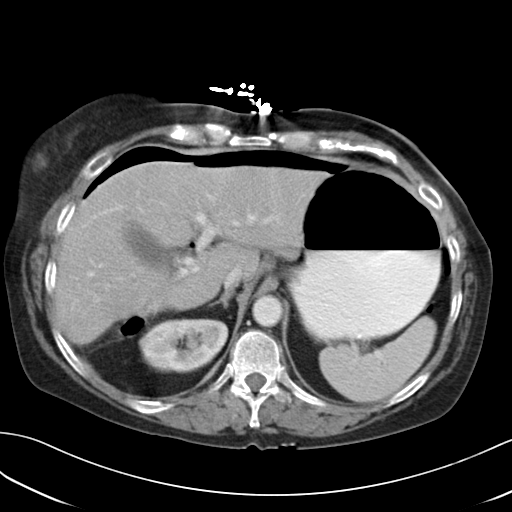
[im 73/87  soft-tissue]
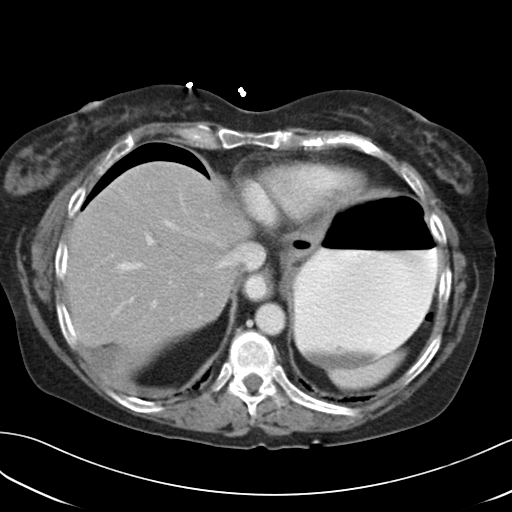
[im 82/87  soft-tissue]
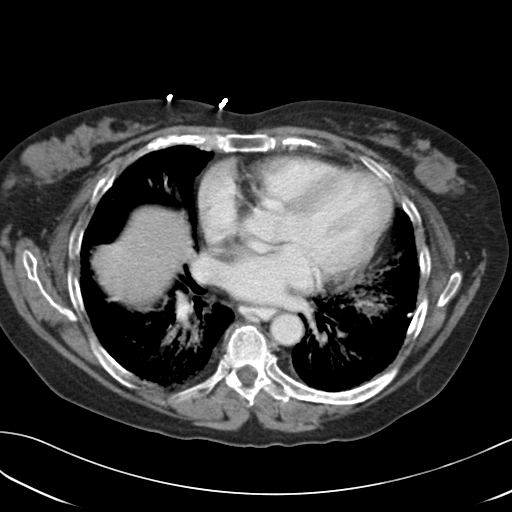

[Series 5: cor routine abd pel with · coronal · 0.67mm/px · 3 of 115 slices shown]
[im 39/115  soft-tissue]
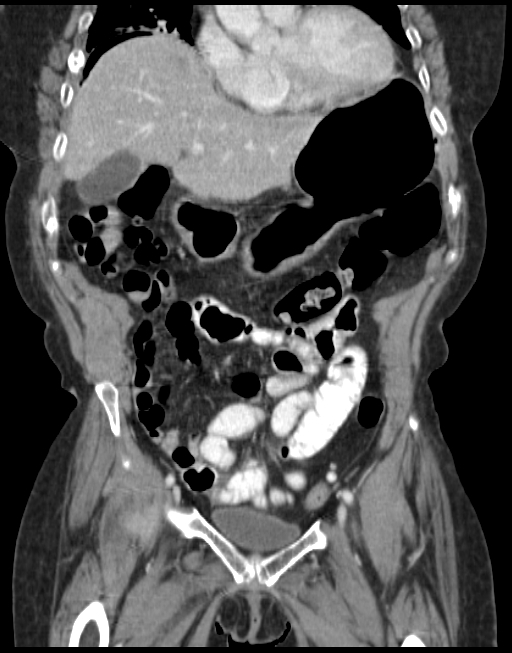
[im 51/115  soft-tissue]
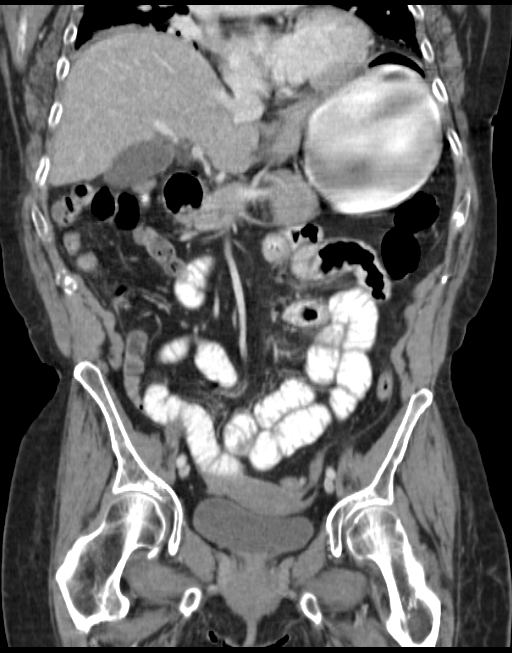
[im 64/115  soft-tissue]
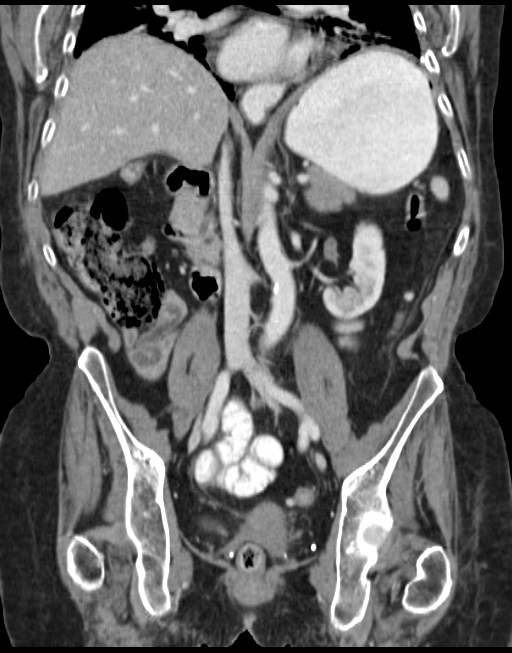

[16 of 46 positions shown; findings below may reference images not displayed]

FINDINGS: A moderate amount of free intraperitoneal air is seen, consistent
with perforated viscus. There is a small amount of extravasated oral
contrast material seen adjacent to an area of mild gastric wall
thickening along the greater curvature of the gastric body,
suspicious for perforated gastric ulcer.

The liver, pancreas, spleen, adrenal glands, and kidneys are normal
in appearance. No evidence of hydronephrosis. No soft tissue masses
or lymphadenopathy are identified within the abdomen or pelvis.
Uterus and adnexal regions are unremarkable in appearance.

Colonic diverticulosis is noted, however there is no evidence of
diverticulitis. No other inflammatory process identified. No
evidence of bowel obstruction. Bibasilar chronic interstitial lung
disease noted.
IMPRESSION: Moderate amount of free intraperitoneal air, consistent with
perforated viscus. This is likely due to a perforated gastric ulcer
along the greater curvature of the gastric body.

Diverticulosis. No radiographic evidence of diverticulitis.

Critical Value/emergent results were called by telephone at the time
of interpretation on 08/26/2013 at [DATE] to HIMINDO MUJIONO, PA, who
verbally acknowledged these results.

## 2015-01-24 NOTE — Discharge Summary (Signed)
PATIENT NAME:  Tanya Horne, Tanya Horne MR#:  [id number] DATE OF BIRTH:  Apr 19, 1951  DATE OF ADMISSION:  03/20/2012 DATE OF DISCHARGE:  03/29/2012  DISCHARGE DIAGNOSES:  1. Acute on chronic respiratory failure, on 5 to 6 liters oxygen via nasal cannula as new baseline, likely due to underlying pneumonia with severe underlying pulmonary fibrosis, on prednisone, CellCept, and dapsone.  2. Pneumonia, treated with Levaquin.  3. Possible pulmonary edema, now resolved.  4. Echocardiogram showing good LV function with ejection fraction more than 55%. The patient is feeling much better. No congestive heart failure. She is on 5 to 6 liters nasal cannula.  5. Severe pulmonary fibrosis, on CellCept and dapsone. Under evaluation for lung transplant at [facility].   SECONDARY DIAGNOSIS: Pulmonary fibrosis.   CONSULTANTS:  1. Speech Therapy.  2. Flora Lipps, MD - Pulmonary. 3. Physical Therapy.  4. Social Work. 5. Palliative Care.   PROCEDURES/RADIOLOGY: CT scan of the chest with contrast on 03/20/2012 showed no evidence of PE. Bilateral patchy areas of ground-glass opacities and mild interstitial thickening superimposed upon chronic interstitial lung disease.   Chest x-ray on 03/20/2012 showed chronic inflammatory changes, possible right mid lung field pneumonia.   Chest x-ray on 03/22/2012 showed improvement in the right mid lung field.   2-D echocardiogram on 03/20/2012 showed normal LV systolic function. Ejection fraction more than 55%. Elevated RV systolic pressure at 50 to 60 mmHg. No pericardial effusion. Trace mitral regurgitation. Mild to moderate tricuspid regurgitation.   MAJOR LABORATORY PANEL: Blood cultures x2 were negative, on 03/21/2012.   HISTORY AND SHORT HOSPITAL COURSE: The patient is a 64 year old female with the above-mentioned medical problems who was admitted for acute respiratory failure thought to be secondary to pneumonia and severe underlying pulmonary fibrosis. Please see Dr. Emeline Gins  Elgergawy's dictated history and physical for further details Pulmonary consultation was obtained with Dr. Mortimer Fries who recommended starting her on IV antibiotics and closely monitor her. She was requiring high flow nasal cannula throughout her course and was slowly weaned off that and was placed on 5 to 6 liters nasal cannula. Palliative care consult was obtained who are following her throughout the course. Physical therapy and occupational therapy and speech therapy consultations were also obtained who were working with her in the hospital and was making slow progress. The patient was stable enough to be discharged home with hospice services as she would not qualify for any other skilled therapy due to lack of insurance. On 03/29/2012, after discussion with consultants, the patient and care management, the patient was discharged home under hospice services.   DISCHARGE PHYSICAL EXAMINATION:  VITALS: On the day of discharge, her temperature was 98.1, heart rate 94 per minute, respirations 18 per minute, blood pressure 113/73 mmHg, and she was saturating 90% on 5 liters oxygen via nasal cannula and would require 6 liters on minimal exertion. She gets  tachycardic also with minimal exertion.   CARDIOVASCULAR: S1 and S2 normal. No murmur, rubs, or gallops.  LUNGS: Clear to auscultation bilaterally. Decreased breath sounds at the bases.   NEUROLOGIC: Nonfocal examination.   ABDOMEN: Soft and benign.   All other physical examination remained at baseline.     DISCHARGE MEDICATIONS:  1. Dapsone 100 mg p.o. daily.  2. CellCept 500 mg p.o. twice a day. 3. Prednisolone 10 mg p.o. four times a day. 4. Budesonide one dose inhaled twice a day.  5. DuoNebs every six hours as needed.  6. Alprazolam 0.5 mg p.o. every six hours as needed.  DISCHARGE DIET: Regular.   DISCHARGE ACTIVITY: As tolerated.   DISCHARGE INSTRUCTIONS AND FOLLOW-UP: The patient was instructed to follow-up with her primary care  physician at [facility] clinic on Monday, 04/01/2012, at 1:40 p.m. She will need follow-up with [facility] in 2 to 4 weeks for evaluation of her lung transplant. She will need 5 to 6 liters nasal cannula at her baseline, likely 5 liters at rest and 6 liters with any exertion and at night. She was set up to get hospice services to follow at home. She was also given a wheelchair and bedside commode prescription. She was also recommended to get outpatient pulmonary rehab.   TOTAL TIME DISCHARGING THIS PATIENT: 55 minutes. ____________________________ Lucina Mellow. Manuella Ghazi, MD vss:slb D: 03/30/2012 19:47:00 ET T: 03/31/2012 12:46:58 ET JOB#: [id number]  cc: Carsyn Taubman S. Manuella Ghazi, MD, <Dictator> Cataio, MD Efraim Kaufmann, MD [facility]  [facility]  Lucina Mellow [facility] MD ELECTRONICALLY SIGNED 03/31/2012 19:25

## 2015-01-24 NOTE — H&P (Signed)
PATIENT NAME:  Tanya Horne, Tanya Horne MR#:  [id number] DATE OF BIRTH:  1950/12/30  DATE OF ADMISSION:  03/20/2012  PRIMARY CARE PHYSICIAN: [facility]   REFERRING PHYSICIAN: Robb Matar  CHIEF COMPLAINT: Hemoptysis, shortness of breath.   HISTORY OF PRESENT ILLNESS: This is a 64 year old female who was recently diagnosed with pulmonary fibrosis at [facility]. The patient is suffering from chronic respiratory failure, on home oxygen, which was started in December of last year. It was initially thought to be secondary to chronic obstructive pulmonary disease even though she did not have any history of smoking. The patient had lung biopsy at [facility] in February of this year where she was diagnosed with pulmonary fibrosis. The patient had been on 3 liters nasal cannula since December, but recently increased to 4 liters oxygen last month secondary to worsening shortness of breath. As well the patient has been on CellCept 500 mg oral daily, on dapsone for prophylaxis, and on prednisone tapering dose. She was started on 70 mg of prednisone in February of this year. Currently she has been tapered to 40 mg. The patient reports her shortness of breath is mainly exertional, but she reports she usually has a dry cough but recently and occasionally with clear productive sputum. Reportedly this has been happening more frequently. The patient denies any fever or chills. The patient reports at one point cough was so significant  yesterday where her oxygen saturation came down to 50% on 4 liters nasal cannula. In the ED, the patient is requiring nonrebreather bag 100% oxygen where she is currently saturating 97%. That could not be tapered down. Secondary to the patient's elevated d-dimer and hemoptysis and tachycardia, she had a CT of the chest with angiogram which came back negative for PE, but it did show chronic interstitial changes and patchy bilateral pulmonary infiltrates and superimposed chronic  interstitial disease which is probably due to pulmonary edema. The patient had leukocytosis of 14,000.  Troponins were within normal limits.   PAST MEDICAL HISTORY: Pulmonary fibrosis.   PAST SURGICAL HISTORY:  1. Back surgery.  2. Cesarean section.  3. Appendectomy.   FAMILY HISTORY: No history of pulmonary fibrosis in the family, but history of chronic obstructive pulmonary disease.   SOCIAL HISTORY: The patient used to work in [employer]. She stopped working in October of last year. She used to smoke socially, one cigarette every few months, but has second-hand smoke history.  No alcohol or drug abuse.   HOME MEDICATIONS:  1. CellCept 500 mg daily.  2. Dapsone 100 mg daily.  3. Prednisone 40 mg daily.   DRUG ALLERGIES: Sulfa drugs causing blisters.   REVIEW OF SYSTEMS: CONSTITUTIONAL: The patient denies any fever, weight gain, or weight loss. EYES: Denies any blurry vision, double vision, or pain. ENT: Denies tinnitus, ear pain, or hearing loss. RESPIRATORY: Complains of cough, productive. Has complaints of hemoptysis. Has complaint of dyspnea. No history of chronic obstructive pulmonary disease. CARDIOVASCULAR: Denies any chest pain, orthopnea, edema, palpitations, or syncope. GASTROINTESTINAL: Denies nausea, vomiting, diarrhea, or abdominal pain. GENITOURINARY: Denies dysuria, hematuria, or renal colic. ENDOCRINE: Denies polyuria, polydipsia, heat or cold intolerance. HEMATOLOGY: Denies any easy bruising, bleeding, diathesis, or history of blood clots. MUSCULOSKELETAL: Denies any back pain, shoulder pain, swelling, or gout. NEURO: Denies numbness, weakness, dysarthria, epilepsy, or tremors. PSYCH: Denies any anxiety, insomnia, or drug or alcohol abuse.   PHYSICAL EXAMINATION:   VITAL SIGNS: Temperature 97.1, pulse 99, respiratory rate 24, blood pressure 120/69, and  saturating 97% on nonrebreather bag.  GENERAL: Well-nourished female who looks comfortable in bed, in no apparent distress.    HEENT: Head atraumatic, normocephalic. Pupils equal and reactive to light. Pink conjunctivae. Anicteric sclerae. Moist oral mucosa.   NECK: Supple. No thyromegaly. No JVD.   CHEST: Good air entry bilaterally. No wheezing. Has mild rales.   CARDIOVASCULAR: S1 and S2 heard. No rubs, murmurs, or gallops.   ABDOMEN: Soft, nontender, and nondistended. Bowel sounds present.   EXTREMITIES: No edema. No clubbing. No cyanosis.   PSYCHIATRIC: Appropriate affect. Awake and alert x3.   NEUROLOGIC: Cranial nerves grossly intact. Motor 5/5.  SKIN: No rash.   PERTINENT LABS: BNP 119. Glucose 105, BUN 21, creatinine 0.71, sodium 141, potassium 4, chloride 105, CO2 28, and total protein 6.9. Total bilirubin 0.5, alkaline phosphatase 67, AST 37, and ALT 34. Troponin 0.02. White blood cells 14.2, hemoglobin 10.6, hematocrit 34.2, and platelets 266. D-dimer 2.1.  IMAGING: CT of the chest with IV contrast: No evidence of PE. Patchy bilateral alveolar infiltrates, probably due to pulmonary edema and evidence of chronic interstitial changes.   ASSESSMENT AND PLAN:  1. Acute on chronic respiratory failure. The patient has history of pulmonary fibrosis with acute significant worsening. In the differential diagnosis, an infectious process, possibly bronchitis versus pneumonia. No strong evidence of pneumonia on the CAT, but given the fact that she has productive sputum, we will start her on IV erythromycin, and as she is having evidence of pulmonary edema on her CAT scan, we will check her BNP level and we will check 2-D Echo to evaluate for systolic or diastolic heart failure, even though clinically she does not appear to be in heart failure, no edema, and no JVD. We will switch her p.o. prednisone to IV Solu-Medrol. We will have her on Protonix for GI prophylaxis secondary to IV steroids. We will keep her on nonrebreather bag to keep her oxygen saturation above 97%. We will admit her to Critical Care Unit. I  discussed with Dr. Devona Konig, from pulmonary, who will see her today.  2. Leukocytosis. The patient is afebrile. This is most likely secondary to steroid use.       CODE STATUS: THE PATIENT IS FULL CODE.    I discussed with the patient and family at bedside.   TOTAL TIME SPENT ON PATIENT CARE: 55 minutes. ____________________________ Albertine Patricia, MD dse:slb D: 03/20/2012 08:28:50 ET T: 03/20/2012 09:39:56 ET JOB#: [id number]  cc: Albertine Patricia, MD, <Dictator> PCP - [place] MD ELECTRONICALLY SIGNED 03/21/2012 5:42

## 2015-06-24 ENCOUNTER — Encounter: Payer: Self-pay | Admitting: *Deleted

## 2015-06-24 ENCOUNTER — Emergency Department: Payer: Medicare Other

## 2015-06-24 ENCOUNTER — Emergency Department
Admission: EM | Admit: 2015-06-24 | Discharge: 2015-06-25 | Disposition: A | Discharge status: Home / Self Care | Payer: Medicare Other | Attending: Emergency Medicine | Admitting: Emergency Medicine

## 2015-06-24 DIAGNOSIS — R0600 Dyspnea, unspecified: Secondary | ICD-10-CM | POA: Diagnosis not present

## 2015-06-24 DIAGNOSIS — Z87891 Personal history of nicotine dependence: Secondary | ICD-10-CM | POA: Diagnosis not present

## 2015-06-24 DIAGNOSIS — R0602 Shortness of breath: Secondary | ICD-10-CM | POA: Diagnosis present

## 2015-06-24 DIAGNOSIS — Z79899 Other long term (current) drug therapy: Secondary | ICD-10-CM | POA: Insufficient documentation

## 2015-06-24 LAB — TROPONIN I: Troponin I: <0.03 ng/mL (ref <0.031)

## 2015-06-24 LAB — BRAIN NATRIURETIC PEPTIDE: B NATRIURETIC PEPTIDE 5: 48 pg/mL (ref 0.0–100.0)

## 2015-06-24 MED ORDER — IPRATROPIUM-ALBUTEROL 0.5-2.5 (3) MG/3ML IN SOLN
3.0000 mL | Freq: Once | RESPIRATORY_TRACT | Status: AC
  Administered 2015-06-24: 3 mL via RESPIRATORY_TRACT
  Filled 2015-06-24: qty 3

## 2015-06-24 NOTE — ED Notes (Signed)
Pt reports increase in shortness of breath and congestion. Pt on 5L O2.

## 2015-06-24 NOTE — ED Provider Notes (Signed)
[facility] Emergency Department Provider Note  ____________________________________________  Time seen: Approximately 10:37 PM  I have reviewed the triage vital signs and the nursing notes.   HISTORY  Chief Complaint Shortness of Breath    HPI Tanya Horne is a 64 y.o. female who reports she began getting short of breath last night progressed and worsened this morning. Patient is coughing up small amounts of clear liquid  sputum. She reports she feels better now that she is coughed up a bunch of the sputum. Patient reports she has pulmonary fibrosis which is progressive. She reports she was on CellCept and dapsone which gave her she says a perforated gastric ulcer which she had emergency surgery for last Thanksgiving   Past Medical History  Diagnosis Date  . Fibroid   . Gastric ulcer   . GERD (gastroesophageal reflux disease)   . Pulmonary fibrosis     Patient Active Problem List   Diagnosis Date Noted  . Acute-on-chronic respiratory failure 08/26/2013  . Pre-operative respiratory examination 08/26/2013  . Chronic use of steroids 08/26/2013  . Adrenal insufficiency 08/26/2013  . Pulmonary fibrosis 08/26/2013  . Severe hypoxemia 08/26/2013  . Perforated gastric ulcer 08/26/2013    Past Surgical History  Procedure Laterality Date  . Laparotomy  08/26/2013    Procedure: EXPLORATORY LAPAROTOMY closure of gastric ulcer;  Surgeon: Adin Hector, MD;  Location: [facility] ORS;  Service: General;;  . Lung biopsy    . Cesarean section    . Appendectomy      Current Outpatient Rx  Name  Route  Sig  Dispense  Refill  . albuterol (PROVENTIL) (2.5 MG/3ML) 0.083% nebulizer solution   Nebulization   Take 2.5 mg by nebulization every 6 (six) hours as needed for wheezing or shortness of breath.         . lactose free nutrition (BOOST PLUS) LIQD   Oral   Take 237 mLs by mouth 2 (two) times daily between meals.      0   . pantoprazole (PROTONIX) 40  MG tablet   Oral   Take 1 tablet (40 mg total) by mouth 2 (two) times daily before a meal.   60 tablet   6   . predniSONE (DELTASONE) 5 MG tablet   Oral   Take 1 tablet (5 mg total) by mouth daily with breakfast.   30 tablet   6     Allergies Sulfa antibiotics  Family History  Problem Relation Age of Onset  . Cancer Sister     lymphoma, leukemia  . Cancer Brother     pancreatic  . Heart disease Brother     Social History Social History  Substance Use Topics  . Smoking status: Former Research scientist (life sciences)  . Smokeless tobacco: Never Used  . Alcohol Use: No    Review of Systems Constitutional: No fever/chills Eyes: No visual changes. ENT: No sore throat. Cardiovascular: Denies chest pain. Respiratory: See history of present illness Gastrointestinal: No abdominal pain.  No nausea, no vomiting.  No diarrhea.  No constipation. Genitourinary: Negative for dysuria. Musculoskeletal: Negative for back pain. Skin: Negative for rash. Neurological: Negative for headaches, focal weakness or numbness.  10-point ROS otherwise negative.  ____________________________________________   PHYSICAL EXAM:  VITAL SIGNS: ED Triage Vitals  Enc Vitals Group     BP 06/24/15 2139 128/67 mmHg     Pulse Rate 06/24/15 2139 116     Resp 06/24/15 2139 22     Temp 06/24/15 2139 98.3 F (  36.8 C)     Temp Source 06/24/15 2139 Oral     SpO2 06/24/15 2139 100 %     Weight --      Height --      Head Cir --      Peak Flow --      Pain Score --      Pain Loc --      Pain Edu? --      Excl. in [place]? --     Constitutional: Alert and oriented. Thin and chronically ill-appearing femalein no acute distress. Eyes: Conjunctivae are normal. PERRL. EOMI. Head: Atraumatic. Nose: No congestion/rhinnorhea. Mouth/Throat: Mucous membranes are moist.  Oropharynx non-erythematous. Neck: No stridor. Cardiovascular: Normal rate, regular rhythm. Grossly normal heart sounds.  Good peripheral  circulation. Respiratory: Normal respiratory effort.  No retractions. Lungs CTAB. Gastrointestinal: Soft and nontender. No distention. No abdominal bruits. No CVA tenderness. Musculoskeletal: No lower extremity tenderness nor edema.  No joint effusions. Neurologic:  Normal speech and language. No gross focal neurologic deficits are appreciated. No gait instability. Skin:  Skin is warm, dry and intact. No rash noted. Psychiatric: Mood and affect are normal. Speech and behavior are normal.  ____________________________________________   LABS (all labs ordered are listed, but only abnormal results are displayed)  Labs Reviewed  TROPONIN I  BRAIN NATRIURETIC PEPTIDE   ____________________________________________  EKG  EKG read and interpreted by me shows sinus tachycardia rate of 1:15 left axis decreased R-wave progression possible LVH no apparent acute changes ____________________________________________  RADIOLOGY  Chest x-ray reviewed by me read by the radiologist as increasing pulmonary fibrosis ____________________________________________   PROCEDURES    ____________________________________________   INITIAL IMPRESSION / ASSESSMENT AND PLAN / ED COURSE  Pertinent labs & imaging results that were available during my care of the patient were reviewed by me and considered in my medical decision making (see chart for details). Had 10 of 12 patient reports she feels well and has felt well she reports her heart rate often runs high. Her regular doctors are aware of it and have been watching it. Patient reports she really did not have any pleuritic chest pain. She felt like she was having her usual pulmonary fibrosis symptoms. She promises to return for any further problems.  ____________________________________________   FINAL CLINICAL IMPRESSION(S) / ED DIAGNOSES  Final diagnoses:  Dyspnea      Nena Polio, MD 06/24/15 [phone]

## 2015-10-26 ENCOUNTER — Encounter: Payer: Self-pay | Admitting: *Deleted

## 2015-10-26 ENCOUNTER — Emergency Department: Payer: Medicare Other

## 2015-10-26 ENCOUNTER — Inpatient Hospital Stay
Admission: EM | Admit: 2015-10-26 | Discharge: 2015-10-28 | DRG: 190 | Disposition: A | Discharge status: Home / Self Care | Payer: Medicare Other | Attending: Internal Medicine | Admitting: Internal Medicine

## 2015-10-26 DIAGNOSIS — Z806 Family history of leukemia: Secondary | ICD-10-CM

## 2015-10-26 DIAGNOSIS — Z9981 Dependence on supplemental oxygen: Secondary | ICD-10-CM | POA: Diagnosis not present

## 2015-10-26 DIAGNOSIS — R531 Weakness: Secondary | ICD-10-CM | POA: Diagnosis present

## 2015-10-26 DIAGNOSIS — Z8711 Personal history of peptic ulcer disease: Secondary | ICD-10-CM

## 2015-10-26 DIAGNOSIS — Z66 Do not resuscitate: Secondary | ICD-10-CM | POA: Diagnosis present

## 2015-10-26 DIAGNOSIS — Z87891 Personal history of nicotine dependence: Secondary | ICD-10-CM

## 2015-10-26 DIAGNOSIS — Z23 Encounter for immunization: Secondary | ICD-10-CM | POA: Diagnosis not present

## 2015-10-26 DIAGNOSIS — R000 Tachycardia, unspecified: Secondary | ICD-10-CM | POA: Diagnosis not present

## 2015-10-26 DIAGNOSIS — N390 Urinary tract infection, site not specified: Secondary | ICD-10-CM | POA: Diagnosis present

## 2015-10-26 DIAGNOSIS — Z8249 Family history of ischemic heart disease and other diseases of the circulatory system: Secondary | ICD-10-CM | POA: Diagnosis not present

## 2015-10-26 DIAGNOSIS — R0602 Shortness of breath: Secondary | ICD-10-CM

## 2015-10-26 DIAGNOSIS — J9621 Acute and chronic respiratory failure with hypoxia: Secondary | ICD-10-CM | POA: Diagnosis not present

## 2015-10-26 DIAGNOSIS — K219 Gastro-esophageal reflux disease without esophagitis: Secondary | ICD-10-CM | POA: Diagnosis not present

## 2015-10-26 DIAGNOSIS — Z807 Family history of other malignant neoplasms of lymphoid, hematopoietic and related tissues: Secondary | ICD-10-CM | POA: Diagnosis not present

## 2015-10-26 DIAGNOSIS — R0902 Hypoxemia: Secondary | ICD-10-CM | POA: Diagnosis present

## 2015-10-26 DIAGNOSIS — A419 Sepsis, unspecified organism: Secondary | ICD-10-CM | POA: Diagnosis present

## 2015-10-26 DIAGNOSIS — J441 Chronic obstructive pulmonary disease with (acute) exacerbation: Principal | ICD-10-CM | POA: Diagnosis present

## 2015-10-26 DIAGNOSIS — J8410 Pulmonary fibrosis, unspecified: Secondary | ICD-10-CM

## 2015-10-26 LAB — CBC WITH DIFFERENTIAL/PLATELET
Basophils Absolute: 0 10*3/uL (ref 0–0.1)
Basophils Relative: 1 %
EOS PCT: 12 %
Eosinophils Absolute: 0.8 10*3/uL — ABNORMAL HIGH (ref 0–0.7)
HEMATOCRIT: 32.4 % — AB (ref 35.0–47.0)
Hemoglobin: 9.9 g/dL — ABNORMAL LOW (ref 12.0–16.0)
Lymphocytes Relative: 14 %
Lymphs Abs: 0.9 10*3/uL — ABNORMAL LOW (ref 1.0–3.6)
MCH: 23.1 pg — AB (ref 26.0–34.0)
MCHC: 30.6 g/dL — ABNORMAL LOW (ref 32.0–36.0)
MCV: 75.5 fL — AB (ref 80.0–100.0)
MONO ABS: 0.5 10*3/uL (ref 0.2–0.9)
Monocytes Relative: 7 %
NEUTROS ABS: 4.5 10*3/uL (ref 1.4–6.5)
Neutrophils Relative %: 66 %
PLATELETS: 245 10*3/uL (ref 150–440)
RBC: 4.29 MIL/uL (ref 3.80–5.20)
RDW: 16.9 % — AB (ref 11.5–14.5)
WBC: 6.7 10*3/uL (ref 3.6–11.0)

## 2015-10-26 LAB — URINALYSIS COMPLETE WITH MICROSCOPIC (ARMC ONLY)
Bilirubin Urine: NEGATIVE
GLUCOSE, UA: NEGATIVE mg/dL
Ketones, ur: NEGATIVE mg/dL
Nitrite: NEGATIVE
PROTEIN: 100 mg/dL — AB
SPECIFIC GRAVITY, URINE: 1.029 (ref 1.005–1.030)
pH: 5 (ref 5.0–8.0)

## 2015-10-26 LAB — COMPREHENSIVE METABOLIC PANEL
ALT: 11 U/L — AB (ref 14–54)
AST: 21 U/L (ref 15–41)
Albumin: 3.9 g/dL (ref 3.5–5.0)
Alkaline Phosphatase: 70 U/L (ref 38–126)
Anion gap: 10 (ref 5–15)
BUN: 14 mg/dL (ref 6–20)
CHLORIDE: 99 mmol/L — AB (ref 101–111)
CO2: 28 mmol/L (ref 22–32)
CREATININE: 0.69 mg/dL (ref 0.44–1.00)
Calcium: 9.1 mg/dL (ref 8.9–10.3)
Glucose, Bld: 127 mg/dL — ABNORMAL HIGH (ref 65–99)
POTASSIUM: 3.5 mmol/L (ref 3.5–5.1)
Sodium: 137 mmol/L (ref 135–145)
TOTAL PROTEIN: 8.5 g/dL — AB (ref 6.5–8.1)

## 2015-10-26 LAB — TROPONIN I

## 2015-10-26 LAB — LACTIC ACID, PLASMA: LACTIC ACID, VENOUS: 3.7 mmol/L — AB (ref 0.5–2.0)

## 2015-10-26 MED ORDER — PIPERACILLIN-TAZOBACTAM 3.375 G IVPB
3.3750 g | Freq: Once | INTRAVENOUS | Status: AC
  Administered 2015-10-26: 3.375 g via INTRAVENOUS
  Filled 2015-10-26: qty 50

## 2015-10-26 MED ORDER — METHYLPREDNISOLONE SODIUM SUCC 125 MG IJ SOLR
125.0000 mg | Freq: Once | INTRAMUSCULAR | Status: AC
  Administered 2015-10-26: 125 mg via INTRAVENOUS
  Filled 2015-10-26: qty 2

## 2015-10-26 MED ORDER — SODIUM CHLORIDE 0.9 % IV BOLUS (SEPSIS)
900.0000 mL | Freq: Once | INTRAVENOUS | Status: AC
  Administered 2015-10-26: 900 mL via INTRAVENOUS

## 2015-10-26 MED ORDER — VANCOMYCIN HCL IN DEXTROSE 1-5 GM/200ML-% IV SOLN
1000.0000 mg | Freq: Once | INTRAVENOUS | Status: AC
  Administered 2015-10-26: 1000 mg via INTRAVENOUS
  Filled 2015-10-26: qty 200

## 2015-10-26 NOTE — ED Provider Notes (Signed)
[facility] Emergency Department Provider Note  ____________________________________________  Time seen: Approximately 9:49 PM  I have reviewed the triage vital signs and the nursing notes.   HISTORY  Chief Complaint Shortness of Breath    HPI Tanya Horne is a 65 y.o. female with history of pulmonary fibrosis with chronic 5-10L home oxygen requirement presenting from home with SOB today, gradual onset, constant for several hours. The patient reports that her power went off today and she cannot use her oxygen. She has already used up her 2 emergency tanks. No chest pain. She has no other complaints including no vomiting, diarrhea, fevers or chills. She has otherwise been in her usual state of health.   Past Medical History  Diagnosis Date  . Fibroid   . Gastric ulcer   . GERD (gastroesophageal reflux disease)   . Pulmonary fibrosis [facility])     Patient Active Problem List   Diagnosis Date Noted  . Acute-on-chronic respiratory failure ([place]) 08/26/2013  . Pre-operative respiratory examination 08/26/2013  . Chronic use of steroids 08/26/2013  . Adrenal insufficiency ([place]) 08/26/2013  . Pulmonary fibrosis ([place]) 08/26/2013  . Severe hypoxemia 08/26/2013  . Perforated gastric ulcer ([place]) 08/26/2013    Past Surgical History  Procedure Laterality Date  . Laparotomy  08/26/2013    Procedure: EXPLORATORY LAPAROTOMY closure of gastric ulcer;  Surgeon: Adin Hector, MD;  Location: [facility]S;  Service: General;;  . Lung biopsy    . Cesarean section    . Appendectomy      Current Outpatient Rx  Name  Route  Sig  Dispense  Refill  . albuterol (PROVENTIL) (2.5 MG/3ML) 0.083% nebulizer solution   Nebulization   Take 2.5 mg by nebulization every 6 (six) hours as needed for wheezing or shortness of breath.         . lactose free nutrition (BOOST PLUS) LIQD   Oral   Take 237 mLs by mouth 2 (two) times daily between meals.      0   . pantoprazole  (PROTONIX) 40 MG tablet   Oral   Take 1 tablet (40 mg total) by mouth 2 (two) times daily before a meal.   60 tablet   6   . predniSONE (DELTASONE) 5 MG tablet   Oral   Take 1 tablet (5 mg total) by mouth daily with breakfast. Patient not taking: Reported on 10/26/2015   30 tablet   6     Allergies Sulfa antibiotics  Family History  Problem Relation Age of Onset  . Cancer Sister     lymphoma, leukemia  . Cancer Brother     pancreatic  . Heart disease Brother     Social History Social History  Substance Use Topics  . Smoking status: Former Research scientist (life sciences)  . Smokeless tobacco: Never Used  . Alcohol Use: No    Review of Systems Constitutional: No fever/chills Eyes: No visual changes. ENT: No sore throat. Cardiovascular: Denies chest pain. Respiratory: +shortness of breath. Gastrointestinal: No abdominal pain.  No nausea, no vomiting.  No diarrhea.  No constipation. Genitourinary: Negative for dysuria. Musculoskeletal: Negative for back pain. Skin: Negative for rash. Neurological: Negative for headaches, focal weakness or numbness.  10-point ROS otherwise negative.  ____________________________________________   PHYSICAL EXAM:  VITAL SIGNS: ED Triage Vitals  Enc Vitals Group     BP 10/26/15 2134 156/113 mmHg     Pulse Rate 10/26/15 2134 147     Resp 10/26/15 2134 38  Temp 10/26/15 2134 98.2 F (36.8 C)     Temp Source 10/26/15 2134 Oral     SpO2 --      Weight 10/26/15 2134 80 lb (36.288 kg)     Height 10/26/15 2134 4\' 11"  (1.499 m)     Head Cir --      Peak Flow --      Pain Score --      Pain Loc --      Pain Edu? --      Excl. in [place]? --     Constitutional: Alert and oriented. Chronically-ill appearing, in mild distress due to shortness of breath. Eyes: Conjunctivae are normal. PERRL. EOMI. Head: Atraumatic. Nose: No congestion/rhinnorhea. Mouth/Throat: Mucous membranes are moist.  Oropharynx non-erythematous. Neck: No stridor.   Cardiovascular:  tachycardic rate, regular rhythm. Grossly normal heart sounds.  Good peripheral circulation. Respiratory: Tachypnea with mildly increased work of breathing. Coarse breath sounds throughout. Gastrointestinal: Soft and nontender. No distention. No CVA tenderness. Genitourinary: deferred Musculoskeletal: No lower extremity tenderness nor edema.  No joint effusions. Neurologic:  Normal speech and language. No gross focal neurologic deficits are appreciated. Skin:  Skin is warm, dry and intact. No rash noted. Psychiatric: Mood and affect are normal. Speech and behavior are normal.  ____________________________________________   LABS (all labs ordered are listed, but only abnormal results are displayed)  Labs Reviewed  CBC WITH DIFFERENTIAL/PLATELET - Abnormal; Notable for the following:    Hemoglobin 9.9 (*)    HCT 32.4 (*)    MCV 75.5 (*)    MCH 23.1 (*)    MCHC 30.6 (*)    RDW 16.9 (*)    Lymphs Abs 0.9 (*)    Eosinophils Absolute 0.8 (*)    All other components within normal limits  COMPREHENSIVE METABOLIC PANEL - Abnormal; Notable for the following:    Chloride 99 (*)    Glucose, Bld 127 (*)    Total Protein 8.5 (*)    ALT 11 (*)    Total Bilirubin <0.1 (*)    All other components within normal limits  LACTIC ACID, PLASMA - Abnormal; Notable for the following:    Lactic Acid, Venous 3.7 (*)    All other components within normal limits  URINALYSIS COMPLETEWITH MICROSCOPIC ([facility] ONLY) - Abnormal; Notable for the following:    Color, Urine YELLOW (*)    APPearance HAZY (*)    Hgb urine dipstick 2+ (*)    Protein, ur 100 (*)    Leukocytes, UA 3+ (*)    Bacteria, UA RARE (*)    Squamous Epithelial / LPF 0-5 (*)    All other components within normal limits  CULTURE, BLOOD (ROUTINE X 2)  CULTURE, BLOOD (ROUTINE X 2)  URINE CULTURE  TROPONIN I  LACTIC ACID, PLASMA   ____________________________________________  EKG  ED ECG REPORT I, Joanne Gavel, the attending  physician, personally viewed and interpreted this ECG.   Date: 10/26/2015  EKG Time: 22:02  Rate: 128  Rhythm: sinus tachycardia  Axis: normal  Intervals:none  ST&T Change: No acute ST elevation. Question minimal ST depression in lead 1, aVL.  ____________________________________________  RADIOLOGY  CXR IMPRESSION: Diffuse pulmonary fibrosis with associated low lung volumes similar to prior study. No superimposed consolidation.  ____________________________________________   PROCEDURES  Procedure(s) performed: None  Critical Care performed: No  ____________________________________________   INITIAL IMPRESSION / ASSESSMENT AND PLAN / ED COURSE  Pertinent labs & imaging results that were available during my care of the  patient were reviewed by me and considered in my medical decision making (see chart for details).  Ianthe Frady is a 65 y.o. female with history of pulmonary fibrosis with chronic 5-10L home oxygen requirement presenting from home with SOB today. On exam, she appears moderately short of breath, tachypnea with coarse breath sounds throughout. She is quite tachycardic with heart rate in the 140s but maintaining adequate blood pressure. She is afebrile. Basic labs and chest x-ray pending. Anticipate admission. We'll give Solu-Medrol given that this could represent a flare in her pulmonary fibrosis.  ----------------------------------------- 11:12 PM on 10/26/2015 -----------------------------------------  Chest x-ray unchanged from prior. Tachycardia improves, heart rate 117 at this time. Labs reviewed. CBC notable for chronic anemia with hemoglobin 9.9. CMP generally unremarkable. Troponin negative. Lactic acid is significantly elevated at 3.7 which could be secondary to the fact that she was hypoxic for several hours today off of her O2 however Urinalysis is concerning for possible urinary tract infection, culture sent. Given that she meets 2 out of 4 Sirs  criteria for tachycardia and tachypnea given concern for urinary tract infection, we'll treat as sepsis with 30 mL/kg bolus of normal saline, IV vancomycin and Zosyn were ordered. Case discussed with hospitalist for admission. ____________________________________________   FINAL CLINICAL IMPRESSION(S) / ED DIAGNOSES  Final diagnoses:  Pulmonary fibrosis ([place])  SOB (shortness of breath)  UTI (lower urinary tract infection)  Sepsis, due to unspecified organism ([facility])      Joanne Gavel, MD 10/26/15 2316

## 2015-10-26 NOTE — ED Notes (Signed)
Pt to triage via wheelchair.  Pt has pulmonary fibrosis and is on oxygen.  Pt's power is out.

## 2015-10-26 NOTE — ED Notes (Signed)
Lab called for add on of urine culture

## 2015-10-26 NOTE — ED Notes (Signed)
Lab called for critical Lactic acid of 3.7

## 2015-10-27 ENCOUNTER — Encounter: Payer: Self-pay | Admitting: Internal Medicine

## 2015-10-27 DIAGNOSIS — A419 Sepsis, unspecified organism: Secondary | ICD-10-CM | POA: Diagnosis present

## 2015-10-27 DIAGNOSIS — N390 Urinary tract infection, site not specified: Secondary | ICD-10-CM | POA: Diagnosis present

## 2015-10-27 LAB — CBC WITH DIFFERENTIAL/PLATELET
BASOS ABS: 0 10*3/uL (ref 0–0.1)
BASOS PCT: 0 %
EOS ABS: 0 10*3/uL (ref 0–0.7)
Eosinophils Relative: 1 %
HCT: 29.7 % — ABNORMAL LOW (ref 35.0–47.0)
HEMOGLOBIN: 9 g/dL — AB (ref 12.0–16.0)
Lymphocytes Relative: 4 %
Lymphs Abs: 0.3 10*3/uL — ABNORMAL LOW (ref 1.0–3.6)
MCH: 22.9 pg — ABNORMAL LOW (ref 26.0–34.0)
MCHC: 30.3 g/dL — AB (ref 32.0–36.0)
MCV: 75.7 fL — ABNORMAL LOW (ref 80.0–100.0)
MONO ABS: 0.1 10*3/uL — AB (ref 0.2–0.9)
MONOS PCT: 1 %
NEUTROS PCT: 94 %
Neutro Abs: 6.9 10*3/uL — ABNORMAL HIGH (ref 1.4–6.5)
Platelets: 220 10*3/uL (ref 150–440)
RBC: 3.92 MIL/uL (ref 3.80–5.20)
RDW: 16.9 % — AB (ref 11.5–14.5)
WBC: 7.3 10*3/uL (ref 3.6–11.0)

## 2015-10-27 LAB — COMPREHENSIVE METABOLIC PANEL
ALBUMIN: 3.6 g/dL (ref 3.5–5.0)
ALT: 15 U/L (ref 14–54)
ANION GAP: 7 (ref 5–15)
AST: 24 U/L (ref 15–41)
Alkaline Phosphatase: 74 U/L (ref 38–126)
BUN: 12 mg/dL (ref 6–20)
CO2: 28 mmol/L (ref 22–32)
Calcium: 8.6 mg/dL — ABNORMAL LOW (ref 8.9–10.3)
Chloride: 104 mmol/L (ref 101–111)
Creatinine, Ser: 0.44 mg/dL (ref 0.44–1.00)
GFR calc Af Amer: >60 mL/min (ref >60)
GFR calc non Af Amer: >60 mL/min (ref >60)
GLUCOSE: 129 mg/dL — AB (ref 65–99)
POTASSIUM: 4 mmol/L (ref 3.5–5.1)
SODIUM: 139 mmol/L (ref 135–145)
Total Bilirubin: 0.6 mg/dL (ref 0.3–1.2)
Total Protein: 8.2 g/dL — ABNORMAL HIGH (ref 6.5–8.1)

## 2015-10-27 LAB — LACTIC ACID, PLASMA: LACTIC ACID, VENOUS: 0.8 mmol/L (ref 0.5–2.0)

## 2015-10-27 LAB — PROCALCITONIN

## 2015-10-27 LAB — PROTIME-INR
INR: 0.97
PROTHROMBIN TIME: 13.1 s (ref 11.4–15.0)

## 2015-10-27 LAB — APTT: aPTT: 32 seconds (ref 24–36)

## 2015-10-27 MED ORDER — PANTOPRAZOLE SODIUM 40 MG PO TBEC
40.0000 mg | DELAYED_RELEASE_TABLET | Freq: Two times a day (BID) | ORAL | Status: DC
  Administered 2015-10-27 – 2015-10-28 (×3): 40 mg via ORAL
  Filled 2015-10-27 (×3): qty 1

## 2015-10-27 MED ORDER — ENOXAPARIN SODIUM 40 MG/0.4ML ~~LOC~~ SOLN
40.0000 mg | SUBCUTANEOUS | Status: DC
  Administered 2015-10-27: 40 mg via SUBCUTANEOUS
  Filled 2015-10-27 (×3): qty 0.4

## 2015-10-27 MED ORDER — SENNOSIDES-DOCUSATE SODIUM 8.6-50 MG PO TABS: 1.0000 | ORAL_TABLET | Freq: Every evening | ORAL | Status: DC | PRN

## 2015-10-27 MED ORDER — DILTIAZEM HCL ER COATED BEADS 120 MG PO CP24
120.0000 mg | ORAL_CAPSULE | Freq: Every day | ORAL | Status: DC
  Administered 2015-10-27: 120 mg via ORAL
  Filled 2015-10-27: qty 1

## 2015-10-27 MED ORDER — ACETAMINOPHEN 650 MG RE SUPP: 650.0000 mg | Freq: Four times a day (QID) | RECTAL | Status: DC | PRN

## 2015-10-27 MED ORDER — ONDANSETRON HCL 4 MG PO TABS: 4.0000 mg | ORAL_TABLET | Freq: Four times a day (QID) | ORAL | Status: DC | PRN

## 2015-10-27 MED ORDER — SODIUM CHLORIDE 0.9 % IV SOLN
INTRAVENOUS | Status: DC
  Administered 2015-10-27: 11:00:00 via INTRAVENOUS

## 2015-10-27 MED ORDER — ONDANSETRON HCL 4 MG/2ML IJ SOLN: 4.0000 mg | Freq: Four times a day (QID) | INTRAMUSCULAR | Status: DC | PRN

## 2015-10-27 MED ORDER — BOOST PLUS PO LIQD
237.0000 mL | Freq: Two times a day (BID) | ORAL | Status: DC
  Filled 2015-10-27 (×2): qty 237

## 2015-10-27 MED ORDER — ENOXAPARIN SODIUM 30 MG/0.3ML ~~LOC~~ SOLN
30.0000 mg | SUBCUTANEOUS | Status: DC
  Administered 2015-10-27: 30 mg via SUBCUTANEOUS
  Filled 2015-10-27: qty 0.3

## 2015-10-27 MED ORDER — DEXTROSE 5 % IV SOLN: 2.0000 g | INTRAVENOUS | Status: DC

## 2015-10-27 MED ORDER — ALBUTEROL SULFATE (2.5 MG/3ML) 0.083% IN NEBU: 2.5000 mg | INHALATION_SOLUTION | Freq: Four times a day (QID) | RESPIRATORY_TRACT | Status: DC | PRN

## 2015-10-27 MED ORDER — SODIUM CHLORIDE 0.9% FLUSH
3.0000 mL | Freq: Two times a day (BID) | INTRAVENOUS | Status: DC
  Administered 2015-10-27 (×2): 3 mL via INTRAVENOUS

## 2015-10-27 MED ORDER — SODIUM CHLORIDE 0.9% FLUSH: 3.0000 mL | INTRAVENOUS | Status: DC | PRN

## 2015-10-27 MED ORDER — METHYLPREDNISOLONE SODIUM SUCC 125 MG IJ SOLR
60.0000 mg | INTRAMUSCULAR | Status: DC
  Administered 2015-10-27: 60 mg via INTRAVENOUS
  Filled 2015-10-27: qty 2

## 2015-10-27 MED ORDER — INFLUENZA VAC SPLIT QUAD 0.5 ML IM SUSY
0.5000 mL | PREFILLED_SYRINGE | INTRAMUSCULAR | Status: AC
  Administered 2015-10-28: 0.5 mL via INTRAMUSCULAR
  Filled 2015-10-27: qty 0.5

## 2015-10-27 MED ORDER — SODIUM CHLORIDE 0.9 % IV BOLUS (SEPSIS)
500.0000 mL | INTRAVENOUS | Status: AC
  Administered 2015-10-27: 500 mL via INTRAVENOUS

## 2015-10-27 MED ORDER — ENSURE ENLIVE PO LIQD
237.0000 mL | Freq: Two times a day (BID) | ORAL | Status: DC
  Administered 2015-10-27 – 2015-10-28 (×3): 237 mL via ORAL

## 2015-10-27 MED ORDER — ACETAMINOPHEN 325 MG PO TABS: 650.0000 mg | ORAL_TABLET | Freq: Four times a day (QID) | ORAL | Status: DC | PRN

## 2015-10-27 MED ORDER — PREDNISONE 20 MG PO TABS
40.0000 mg | ORAL_TABLET | Freq: Every day | ORAL | Status: DC
  Administered 2015-10-28: 40 mg via ORAL
  Filled 2015-10-27: qty 2

## 2015-10-27 MED ORDER — PNEUMOCOCCAL VAC POLYVALENT 25 MCG/0.5ML IJ INJ: 0.5000 mL | INJECTION | INTRAMUSCULAR | Status: DC

## 2015-10-27 MED ORDER — SODIUM CHLORIDE 0.9 % IV BOLUS (SEPSIS)
1000.0000 mL | Freq: Once | INTRAVENOUS | Status: AC
  Administered 2015-10-27: 1000 mL via INTRAVENOUS

## 2015-10-27 MED ORDER — CEFTRIAXONE SODIUM 2 G IJ SOLR
2.0000 g | Freq: Once | INTRAMUSCULAR | Status: AC
  Administered 2015-10-27: 2 g via INTRAVENOUS
  Filled 2015-10-27: qty 2

## 2015-10-27 NOTE — Care Management (Signed)
Informed by attending that patient has informed him that she can not discharge home because her electricity was turned off yesterday.  Updated CSW and discussed obtaining- amount of power bill; how much will it take to turn it back on; community resources the patient may have contacted, [facility].  Patient will benefit from home health referral at discharge- at least nursing.

## 2015-10-27 NOTE — H&P (Signed)
[facility] NAME: Tanya Horne    MR#:  [id number]  DATE OF BIRTH:  1951-09-22  DATE OF ADMISSION:  10/26/2015  PRIMARY CARE PHYSICIAN: Patrina Levering, MD   REQUESTING/REFERRING PHYSICIAN:   CHIEF COMPLAINT:   Chief Complaint  Patient presents with  . Shortness of Breath    HISTORY OF PRESENT ILLNESS: Tanya Horne  is a 65 y.o. female with a known history of fibroid uterus, gastric ulcer, GERD, pulmonary fibrosis on home oxygen presented to the emergency room with difficulty breathing and her O2 saturations were low when she presented to ER. Patient said her power at home was off for the last 1 day and she cannot use her oxygen. Power bill was not paid and there is some financial hardship at home. The power supply was cut and she will not use oxygen. No history of any fever or chills. No complaints of cough. Patient was put on oxygen via nasal cannula in the emergency room and IV Solu-Medrol was given and stabilized. Upon evaluation in the ER patient was found to have urinary tract infection and at bedside lactate level was high and she received IV vancomycin and IV Zosyn antibiotics. No history of an abdominal pain. No nausea or vomiting or diarrhea. No history of fall. Patient felt much better after she was put on oxygen in the emergency room.  PAST MEDICAL HISTORY:   Past Medical History  Diagnosis Date  . Fibroid   . Gastric ulcer   . GERD (gastroesophageal reflux disease)   . Pulmonary fibrosis ([place])     PAST SURGICAL HISTORY: Past Surgical History  Procedure Laterality Date  . Laparotomy  08/26/2013    Procedure: EXPLORATORY LAPAROTOMY closure of gastric ulcer;  Surgeon: Adin Hector, MD;  Location: [facility];  Service: General;;  . Lung biopsy    . Cesarean section    . Appendectomy      SOCIAL HISTORY:  Social History  Substance Use Topics  . Smoking status: Former Research scientist (life sciences)  . Smokeless tobacco: Never  Used  . Alcohol Use: No    FAMILY HISTORY:  Family History  Problem Relation Age of Onset  . Cancer Sister     lymphoma, leukemia  . Cancer Brother     pancreatic  . Heart disease Brother     DRUG ALLERGIES:  Allergies  Allergen Reactions  . Sulfa Antibiotics Hives    REVIEW OF SYSTEMS:   CONSTITUTIONAL: No fever, has weakness.  EYES: No blurred or double vision.  EARS, NOSE, AND THROAT: No tinnitus or ear pain.  RESPIRATORY: No cough, shortness of breath, wheezing or hemoptysis.  CARDIOVASCULAR: No chest pain, orthopnea, edema.  GASTROINTESTINAL: No nausea, vomiting, diarrhea or abdominal pain.  GENITOURINARY: No dysuria, hematuria.  ENDOCRINE: No polyuria, nocturia,  HEMATOLOGY: No anemia, easy bruising or bleeding SKIN: No rash or lesion. MUSCULOSKELETAL: No joint pain or arthritis.   NEUROLOGIC: No tingling, numbness, weakness.  PSYCHIATRY: No anxiety or depression.   MEDICATIONS AT HOME:  Prior to Admission medications   Medication Sig Start Date End Date Taking? Authorizing Provider  albuterol (PROVENTIL) (2.5 MG/3ML) 0.083% nebulizer solution Take 2.5 mg by nebulization every 6 (six) hours as needed for wheezing or shortness of breath.    Historical Provider, MD  lactose free nutrition (BOOST PLUS) LIQD Take 237 mLs by mouth 2 (two) times daily between meals. 09/08/13   Erick Colace, NP  pantoprazole (PROTONIX) 40 MG tablet  Take 1 tablet (40 mg total) by mouth 2 (two) times daily before a meal. 09/08/13   Erick Colace, NP  predniSONE (DELTASONE) 5 MG tablet Take 1 tablet (5 mg total) by mouth daily with breakfast. Patient not taking: Reported on 10/26/2015 09/08/13   Erick Colace, NP      PHYSICAL EXAMINATION:   VITAL SIGNS: Blood pressure 149/90, pulse 105, temperature 98.2 F (36.8 C), temperature source Oral, resp. rate 38, height 4\' 11"  (1.499 m), weight 36.288 kg (80 lb), SpO2 98 %.  GENERAL:  65 y.o.-year-old cachectic appearing patient lying in  the bed in mild distress.  EYES: Pupils equal, round, reactive to light and accommodation. No scleral icterus. Extraocular muscles intact.  HEENT: Head atraumatic, normocephalic. Oropharynx and nasopharynx clear.  NECK:  Supple, no jugular venous distention. No thyroid enlargement, no tenderness.  LUNGS: Decreased breath sounds bilaterally, no wheezing, rales,rhonchi or crepitation. No use of accessory muscles of respiration.  CARDIOVASCULAR: S1, S2 normal. No murmurs, rubs, or gallops.  ABDOMEN: Soft, nontender, nondistended. Bowel sounds present. No organomegaly or mass.  EXTREMITIES: No pedal edema, cyanosis, or clubbing.  NEUROLOGIC: Cranial nerves II through XII are intact. Muscle strength 5/5 in all extremities. Sensation intact. Gait not checked.  PSYCHIATRIC: The patient is alert and oriented x 3.  SKIN: No obvious rash, lesion, or ulcer.   LABORATORY PANEL:   CBC  Recent Labs Lab 10/26/15 2152  WBC 6.7  HGB 9.9*  HCT 32.4*  PLT 245  MCV 75.5*  MCH 23.1*  MCHC 30.6*  RDW 16.9*  LYMPHSABS 0.9*  MONOABS 0.5  EOSABS 0.8*  BASOSABS 0.0   ------------------------------------------------------------------------------------------------------------------  Chemistries   Recent Labs Lab 10/26/15 2152  NA 137  K 3.5  CL 99*  CO2 28  GLUCOSE 127*  BUN 14  CREATININE 0.69  CALCIUM 9.1  AST 21  ALT 11*  ALKPHOS 70  BILITOT <0.1*   ------------------------------------------------------------------------------------------------------------------ estimated creatinine clearance is 40.7 mL/min (by C-G formula based on Cr of 0.69). ------------------------------------------------------------------------------------------------------------------ No results for input(s): TSH, T4TOTAL, T3FREE, THYROIDAB in the last 72 hours.  Invalid input(s): FREET3   Coagulation profile No results for input(s): INR, PROTIME in the last 168  hours. ------------------------------------------------------------------------------------------------------------------- No results for input(s): DDIMER in the last 72 hours. -------------------------------------------------------------------------------------------------------------------  Cardiac Enzymes  Recent Labs Lab 10/26/15 2152  TROPONINI <0.03   ------------------------------------------------------------------------------------------------------------------ Invalid input(s): POCBNP  ---------------------------------------------------------------------------------------------------------------  Urinalysis    Component Value Date/Time   COLORURINE YELLOW* 10/26/2015 2253   APPEARANCEUR HAZY* 10/26/2015 2253   LABSPEC 1.029 10/26/2015 2253   PHURINE 5.0 10/26/2015 2253   GLUCOSEU NEGATIVE 10/26/2015 2253   HGBUR 2+* 10/26/2015 2253   BILIRUBINUR NEGATIVE 10/26/2015 2253   KETONESUR NEGATIVE 10/26/2015 2253   PROTEINUR 100* 10/26/2015 2253   NITRITE NEGATIVE 10/26/2015 2253   LEUKOCYTESUR 3+* 10/26/2015 2253     RADIOLOGY: Dg Chest Portable 1 View  10/26/2015  CLINICAL DATA:  Shortness of breath today. Cough. History of pulmonary fibrosis on oxygen. EXAM: PORTABLE CHEST 1 VIEW COMPARISON:  06/24/2015 FINDINGS: Diffuse pulmonary fibrosis with basilar predominance. Low lung volumes consistent with pulmonary fibrosis. No focal airspace disease or consolidation. No blunting of costophrenic angles. No pneumothorax. Normal heart size and pulmonary vascularity. Degenerative changes in the spine. IMPRESSION: Diffuse pulmonary fibrosis with associated low lung volumes similar to prior study. No superimposed consolidation. Electronically Signed   By: Lucienne Capers M.D.   On: 10/26/2015 22:13    EKG: Orders placed or performed during the hospital  encounter of 10/26/15  . ED EKG  . ED EKG  . EKG 12-Lead  . EKG 12-Lead    IMPRESSION AND PLAN: 65 year old female patient  with history of pulmonary fibrosis on home oxygen, GERD, gastric ulcer presented to the emergency room with difficulty breathing. Patient was out of power at home and could not use oxygen she was hypoxic when she presented to the ER. Workup showed patient having a urinary tract infection and elevated lactate level. Patient has tachycadia. patient was stabilized on oxygen in the emergency room..  Admitting diagnosis 1. Acute respiratory distress 2. Pulmonary fibrosis 3. Hypoxia 4. Urinary tract infection 5. Sepsis  Treatment plan Admit patient to telemetry IV fluid resuscitation Oxygen via cannula IV Rocephin antibiotic 1 g daily Resume steroids Follow up the culture.  All the records are reviewed and case discussed with ED provider. Management plans discussed with the patient, family and they are in agreement.  CODE STATUS:FULL    Code Status Orders        Start     Ordered   10/27/15 0011  Full code   Continuous     10/27/15 0011    Code Status History    Date Active Date Inactive Code Status Order ID Comments User Context   08/26/2013  7:19 PM 09/08/2013 11:26 PM DNR [id number]  Juanito Doom, MD Inpatient   08/26/2013 10:12 AM 08/26/2013  7:19 PM Full Code [id number]  Fanny Skates, MD Inpatient   08/26/2013  6:54 AM 08/26/2013 10:12 AM DNR [id number]  Wilhelmina Mcardle, MD ED       TOTAL TIME TAKING CARE OF THIS PATIENT: 50 minutes.    Saundra Shelling M.D on 10/27/2015 at 12:13 AM  Between 7am to 6pm - Pager - [phone]  After 6pm go to [email or web address] - password [email or web address] [facility]  [facility]  Office  [phone]  CC: Primary care physician; Patrina Levering, MD

## 2015-10-27 NOTE — Clinical Social Work Note (Addendum)
Clinical Social Work Assessment  Patient Details  Name: Tanya Horne MRN: [id number] Date of Birth: [birth year]  Date of referral:  10/27/15               Reason for consult:  Financial Concerns                Permission sought to share information with:  Family Supports, Other Permission granted to share information::  Yes, Verbal Permission Granted  Name::        Agency::   ([employer] )  Relationship::   (Tanya Horne (Granddaughter) [phone])  Contact Information:     Housing/Transportation Living arrangements for the past 2 months:  Apartment Source of Information:  Patient, Medical Team, Other (Comment Required) (Tanya Horne (Granddaughter) [phone]) Patient Interpreter Needed:  None Criminal Activity/Legal Involvement Pertinent to Current Situation/Hospitalization:  No - Comment as needed Significant Relationships:  Adult Children, Other Family Members Lives with:  Self Do you feel safe going back to the place where you live?  Yes Need for family participation in patient care:  Yes (Comment) (Tanya "Tanya Horne" Loletha Horne (Granddaughter) [phone])  Care giving concerns:  Patient does not have power at her home due to it being disconnected. Patient needs power to run her O2 concentrator.    Social Worker assessment / plan:  CSW was informed by MD that patient's electricity is disconnected. Per MD patient reports she owes [employer] $500 and cannot be discharged until power is resorted. He reports that patient is ready for discharge but cannot return home unless power is reinstated. CSW met with patient at bedside. Patient was sitting up in bed watching telvision; alert and oriented. CSW inquired about the difficulties she's having with paying her electricity bill. Per patient she owes [employer] [phone]. She reports that her bank account closed without telling her and put her disability check in "limbo". Per patient she recieves [phone] a month in Social  Security Disability. She reports that her power bill is about $120-200 a month. She reports that her bank account is at [employer] and they have been "messing up my check for the past 6 months". She reports that she received her last disability check on December 30th, 2016. She reports that she has not received a January check and her February check is expected on February 3rd. Per patient her granddaughter Tanya Horne is currently trying to receive assistance with paying the power bill. She reports that [employer] would not work with her and stated they couldn't accept a partial pavement.  Verbal Permission granted to speak to her granddaughter Tanya Horne and [employer] on her behalf. She reports that Tanya Horne has about $250 to pay towards bill.   CSW contacted [employer] and left a Advertising account executive. Awaiting a phone call back.   CSW contacted [employer]. CSW requested a Medical Alert to be placed on patient's account. Representativerequested to speak to patient. CSW and patient spoke to representative on speaker phone in her room. CSW also advocated for patient to be placed on a payment arrangement agreement in order to pay the rest of her owed bill. Per customer service representative patient had a returned payment on November 18th, 2016. She reports that patient will have to pay $130 today before 5PM to get power reinstated. She reports that patient then has three options to pay the power bill (1. Pay $213.21 by February 1st, 2017; 2. Two payments made on February 3rd and 13th or 3. Four payments made on  Feb. 3rd, 13 March 14 and April 17). Per customer service representative she will send a Medical Alert form to be completed by MD to CSW's e-mail. She reports it can take the e-mail 24-48 hours. CSW is awaiting e-mail from [employer].   CSW received phone call back from patient's granddaughter Tanya Horne. Tanya Horne reports that she will be able to pay $130 on patient's account today by 5 PM. CSW requested Tanya Horne to  inform CSW once payment has been made and payment arrangements have been established. CSW is awaiting phone call back.   CSW contacted MD [place] and informed him of above. Per MD Leslye Peer MD Tanya Horne will be available to assist with this matter tomorrow.   CSW will continue to follow and assist.   Employment status:  Disabled (Comment on whether or not currently receiving Disability) Insurance information:  Medicare PT Recommendations:  Not assessed at this time Information / Referral to community resources:     Patient/Family's Response to care:  Patient and her granddaughter, Tanya Horne are in agreement to pay $130 today and follow up on the payment plan established by [employer].   Patient/Family's Understanding of and Emotional Response to Diagnosis, Current Treatment, and Prognosis:  Patient and her granddaughter were appreciative to CSW's assistance.   Emotional Assessment Appearance:  Appears older than stated age Attitude/Demeanor/Rapport:   (None ) Affect (typically observed):  Calm Orientation:  Oriented to Self, Oriented to Place, Oriented to  Time, Oriented to Situation Alcohol / Substance use:  Not Applicable Psych involvement (Current and /or in the community):  No (Comment)  Discharge Needs  Concerns to be addressed:  Discharge Planning Concerns, Financial / Insurance Concerns Readmission within the last 30 days:  No Current discharge risk:  Inadequate Financial Supports Barriers to Discharge:  Continued Medical Work up   [employer], LCSW 10/27/2015, 4:12 PM

## 2015-10-27 NOTE — Progress Notes (Signed)
ANTIBIOTIC CONSULT NOTE - INITIAL  Pharmacy Consult for ceftriaxone dosing Indication: UTI  Allergies  Allergen Reactions  . Sulfa Antibiotics Hives    Patient Measurements: Height: 4\' 11"  (149.9 cm) Weight: 80 lb (36.288 kg) IBW/kg (Calculated) : 43.2 Adjusted Body Weight: n/a  Vital Signs: Temp: 98.4 F (36.9 C) (01/25 0133) Temp Source: Oral (01/25 0133) BP: 165/87 mmHg (01/25 0133) Pulse Rate: 107 (01/25 0133) Intake/Output from previous day: 01/24 0701 - 01/25 0700 In: 253 [I.V.:53; IV Piggyback:200] Out: -  Intake/Output from this shift: Total I/O In: 253 [I.V.:53; IV Piggyback:200] Out: -   Labs:  Recent Labs  10/26/15 2152  WBC 6.7  HGB 9.9*  PLT 245  CREATININE 0.69   Estimated Creatinine Clearance: 40.7 mL/min (by C-G formula based on Cr of 0.69). No results for input(s): VANCOTROUGH, VANCOPEAK, VANCORANDOM, GENTTROUGH, GENTPEAK, GENTRANDOM, TOBRATROUGH, TOBRAPEAK, TOBRARND, AMIKACINPEAK, AMIKACINTROU, AMIKACIN in the last 72 hours.   Microbiology: No results found for this or any previous visit (from the past 720 hour(s)).  Medical History: Past Medical History  Diagnosis Date  . Fibroid   . Gastric ulcer   . GERD (gastroesophageal reflux disease)   . Pulmonary fibrosis (HCC)     Medications:   Assessment: Blood and urine cx pending UA: LE(+) NO2(-) WBC TNTC CXR: no consolidation  Goal of Therapy:  Resolution of infection  Plan:  Ceftriaxone 2 grams q 24 hours ordered.  Tanya Horne S 10/27/2015,2:33 AM

## 2015-10-27 NOTE — Progress Notes (Signed)
Patient ID: Tanya Horne, female   DOB: [birth year], 65 y.o.   MRN: [id number] [facility] PROGRESS NOTE  Tanya Horne [id number] DOB: 1951/06/26 DOA: 10/26/2015 PCP: Patrina Levering, MD  HPI/Subjective: Patient states that her breathing is back to her baseline. She states her breathing got worse when they showed up for power and her oxygen was cut off.  Objective: Filed Vitals:   10/27/15 0831 10/27/15 1127  BP: 145/78 132/71  Pulse: 101 116  Temp: 98 F (36.7 C) 98.3 F (36.8 C)  Resp: 18 19    Filed Weights   10/26/15 2134  Weight: 36.288 kg (80 lb)    ROS: Review of Systems  Constitutional: Negative for fever and chills.  Eyes: Negative for blurred vision.  Respiratory: Positive for cough and shortness of breath.   Cardiovascular: Negative for chest pain.  Gastrointestinal: Negative for nausea, vomiting, abdominal pain, diarrhea and constipation.  Genitourinary: Negative for dysuria.  Musculoskeletal: Negative for joint pain.  Neurological: Negative for dizziness and headaches.   Exam: Physical Exam  Constitutional: She is oriented to person, place, and time.  HENT:  Nose: No mucosal edema.  Mouth/Throat: No oropharyngeal exudate or posterior oropharyngeal edema.  Eyes: Conjunctivae, EOM and lids are normal. Pupils are equal, round, and reactive to light.  Neck: No JVD present. Carotid bruit is not present. No edema present. No thyroid mass and no thyromegaly present.  Cardiovascular: S1 normal and S2 normal.  Tachycardia present.  Exam reveals no gallop.   Murmur heard.  Systolic murmur is present with a grade of 2/6  Pulses:      Dorsalis pedis pulses are 2+ on the right side, and 2+ on the left side.  Respiratory: No respiratory distress. She has decreased breath sounds in the right middle field, the right lower field, the left middle field and the left lower field. She has no wheezes. She has rhonchi in the right lower field and the  left lower field. She has no rales.  GI: Soft. Bowel sounds are normal. There is no tenderness.  Musculoskeletal:       Right ankle: She exhibits no swelling.       Left ankle: She exhibits no swelling.  Lymphadenopathy:    She has no cervical adenopathy.  Neurological: She is alert and oriented to person, place, and time. No cranial nerve deficit.  Skin: Skin is warm. No rash noted. Nails show no clubbing.  Psychiatric: She has a normal mood and affect.      Data Reviewed: Basic Metabolic Panel:  Recent Labs Lab 10/26/15 2152 10/27/15 0233  NA 137 139  K 3.5 4.0  CL 99* 104  CO2 28 28  GLUCOSE 127* 129*  BUN 14 12  CREATININE 0.69 0.44  CALCIUM 9.1 8.6*   Liver Function Tests:  Recent Labs Lab 10/26/15 2152 10/27/15 0233  AST 21 24  ALT 11* 15  ALKPHOS 70 74  BILITOT <0.1* 0.6  PROT 8.5* 8.2*  ALBUMIN 3.9 3.6   CBC:  Recent Labs Lab 10/26/15 2152 10/27/15 0233  WBC 6.7 7.3  NEUTROABS 4.5 6.9*  HGB 9.9* 9.0*  HCT 32.4* 29.7*  MCV 75.5* 75.7*  PLT 245 220     Recent Results (from the past 240 hour(s))  Blood culture (routine x 2)     Status: None (Preliminary result)   Collection Time: 10/26/15  9:52 PM  Result Value Ref Range Status   Specimen Description BLOOD BLOOD LEFT FOREARM  Final  Special Requests BOTTLES DRAWN AEROBIC AND ANAEROBIC 5ML  Final   Culture NO GROWTH < 24 HOURS  Final   Report Status PENDING  Incomplete  Blood culture (routine x 2)     Status: None (Preliminary result)   Collection Time: 10/26/15  9:52 PM  Result Value Ref Range Status   Specimen Description BLOOD RIGHT ANTECUBITAL  Final   Special Requests BOTTLES DRAWN AEROBIC AND ANAEROBIC 5ML  Final   Culture NO GROWTH < 24 HOURS  Final   Report Status PENDING  Incomplete  Urine culture     Status: None (Preliminary result)   Collection Time: 10/26/15 10:53 PM  Result Value Ref Range Status   Specimen Description URINE, CLEAN CATCH  Final   Special Requests NONE   Final   Culture NO GROWTH < 12 HOURS  Final   Report Status PENDING  Incomplete  Culture, blood (x 2)     Status: None (Preliminary result)   Collection Time: 10/27/15  2:33 AM  Result Value Ref Range Status   Specimen Description BLOOD RIGHT ANTECUBITAL  Final   Special Requests BOTTLES DRAWN AEROBIC AND ANAEROBIC 10ML  Final   Culture NO GROWTH < 12 HOURS  Final   Report Status PENDING  Incomplete  Culture, blood (x 2)     Status: None (Preliminary result)   Collection Time: 10/27/15  2:35 AM  Result Value Ref Range Status   Specimen Description BLOOD RIGHT WRIST  Final   Special Requests BOTTLES DRAWN AEROBIC AND ANAEROBIC 5ML  Final   Culture NO GROWTH < 12 HOURS  Final   Report Status PENDING  Incomplete     Studies: Dg Chest Portable 1 View  10/26/2015  CLINICAL DATA:  Shortness of breath today. Cough. History of pulmonary fibrosis on oxygen. EXAM: PORTABLE CHEST 1 VIEW COMPARISON:  06/24/2015 FINDINGS: Diffuse pulmonary fibrosis with basilar predominance. Low lung volumes consistent with pulmonary fibrosis. No focal airspace disease or consolidation. No blunting of costophrenic angles. No pneumothorax. Normal heart size and pulmonary vascularity. Degenerative changes in the spine. IMPRESSION: Diffuse pulmonary fibrosis with associated low lung volumes similar to prior study. No superimposed consolidation. Electronically Signed   By: Lucienne Capers M.D.   On: 10/26/2015 22:13    Scheduled Meds: . enoxaparin (LOVENOX) injection  30 mg Subcutaneous Q24H  . feeding supplement (ENSURE ENLIVE)  237 mL Oral BID BM  . [START ON 10/28/2015] Influenza vac split quadrivalent PF  0.5 mL Intramuscular Tomorrow-1000  . pantoprazole  40 mg Oral BID AC  . [START ON 10/28/2015] pneumococcal 23 valent vaccine  0.5 mL Intramuscular Tomorrow-1000  . [START ON 10/28/2015] predniSONE  40 mg Oral Q breakfast  . sodium chloride flush  3 mL Intravenous Q12H    Assessment/Plan:  1. Acute on chronic  respiratory failure with COPD exacerbation. Patient has a history of pulmonary fibrosis. Patient came in with increased respiratory rate and tachycardia once her power was shut off. I cannot safely send her home until her power is turned back on. I spoke with the care manager about this. Start prednisone taper and continue nebulizers. 2. Tachycardia- start low-dose Cardizem CD at night 3. This is not sepsis. Blood cultures and urine culture negative. Patient did not have any urinary symptoms. Stop antibiotics. 4. Gastroesophageal reflux disease without esophagitis on Protonix 5. Weakness- physical therapy eval  Code Status:     Code Status Orders        Start     Ordered  10/27/15 0011  Full code   Continuous     10/27/15 0011    Code Status History    Date Active Date Inactive Code Status Order ID Comments User Context   08/26/2013  7:19 PM 09/08/2013 11:26 PM DNR [id number]  Juanito Doom, MD Inpatient   08/26/2013 10:12 AM 08/26/2013  7:19 PM Full Code [id number]  Fanny Skates, MD Inpatient   08/26/2013  6:54 AM 08/26/2013 10:12 AM DNR [id number]  Wilhelmina Mcardle, MD ED     Disposition Plan: Once power in her house is turned on and she can be discharged home.  Time spent: 30 minutes  Loletha Grayer  [facility]

## 2015-10-27 NOTE — Progress Notes (Signed)
Anticoagulation monitoring(Lovenox):  65 yo  ordered Lovenox 40  mg Q24h  Filed Weights   10/26/15 2134  Weight: 80 lb (36.288 kg)   BMI 16.2 Lab Results  Component Value Date   CREATININE 0.44 10/27/2015   CREATININE 0.69 10/26/2015   CREATININE 0.54 09/08/2013   Estimated Creatinine Clearance: 40.7 mL/min (by C-G formula based on Cr of 0.44). Hemoglobin & Hematocrit     Component Value Date/Time   HGB 9.0* 10/27/2015 0233   HGB 10.9* 08/26/2013 2344   HCT 29.7* 10/27/2015 0233   HCT 34.7* 08/26/2013 2344     Per Protocol for Patient wt <45 kg, will transition to Lovenox 30 mg Q24h.

## 2015-10-27 NOTE — ED Notes (Signed)
Pt grand daughter Jerene Pitch [phone] would like to be called and updated

## 2015-10-28 DIAGNOSIS — J441 Chronic obstructive pulmonary disease with (acute) exacerbation: Secondary | ICD-10-CM | POA: Diagnosis not present

## 2015-10-28 LAB — URINE CULTURE

## 2015-10-28 MED ORDER — DILTIAZEM HCL ER COATED BEADS 120 MG PO CP24: 120.0000 mg | ORAL_CAPSULE | Freq: Every day | ORAL | Status: DC

## 2015-10-28 MED ORDER — DILTIAZEM HCL ER COATED BEADS 120 MG PO CP24: 120.0000 mg | ORAL_CAPSULE | Freq: Every day | ORAL | Status: AC

## 2015-10-28 MED ORDER — DILTIAZEM HCL 25 MG/5ML IV SOLN
10.0000 mg | Freq: Four times a day (QID) | INTRAVENOUS | Status: DC
  Filled 2015-10-28 (×4): qty 5

## 2015-10-28 NOTE — Progress Notes (Signed)
Pt A and O x 4. VSS. Pt tolerating diet well. No complaints of pain or nausea. Pt discharged on portable home O2 tank. Per son power has been turned back on at home and pt will be able to use her oxygen. IV removed intact, prescriptions given. Per pt she no longer has a prescription for prednisone and primary care Dr. Evelina Bucy her off this med. Paged Dr. Posey Pronto and per MD okay for pt to not take this medication at home. Only new med is cardizem. Pt voiced understanding of discharge instructions with no further questions. Pt discharged via wheelchair with axillary.

## 2015-10-28 NOTE — Care Management Important Message (Signed)
Important Message  Patient Details  Name: Tanya Horne MRN: [id number] Date of Birth: December 19, 1950   Medicare Important Message Given:  Yes    Juliann Pulse A [name] 10/28/2015, 9:30 AM

## 2015-10-28 NOTE — Progress Notes (Addendum)
CSW contacted patient's granddaughter, Cassandria Santee to confirm that patient's power was reinstated. Per Tabatha $130 was paid for patient's power bill to have services reinstated. She reports that she made the payment before 5:00 PM 10/28/15. Patient is aware that her power has be reinstated. She reports that she has arranged a payment agreeemnt with [employer]. CSW informed patient that she's ready for discharge today and that her power has been reinstated. She reports her granddaughter will pick her up. Contacted [employer] to see where the Medical Alert paperwork is. Informed by [place] customer services at [employer] and was informed that the Medical Alert can take 3-10 business days to be mailed. Patient requested form to be sent to her home address ([place], [place]). CSW encouraged patient to have [facility] to complete the form during her next appointment. There are no other CSW needs at this time. CSW is signing off. CSW will be available if a need were to arise.   Ernest Pine, MSW, [place] Social Work Department [phone]

## 2015-10-28 NOTE — Evaluation (Signed)
Physical Therapy Evaluation Patient Details Name: Tanya Horne MRN: [id number] DOB: [birth year] Today's Date: 10/28/2015   History of Present Illness  Pt admitted for complaints of SOB as her power was turned off and she was not able to have her home O2 running. Pt with history of pulm fibrosis and COPD. Pt on 5L of home O2 at home.   Clinical Impression  Pt is a pleasant 65 year old female who was admitted for complaints of SOB with acute on chronic CRF. Pt performs  ambulation with supervision and no AD. Pt fatigues with increased distance with noted O2 sats to 88% while on 6L of O2. Pt educated on energy conservation techniques. Pt demonstrates deficits with endurance/strength. B LE weaker compared to B UE.  Would benefit from skilled PT to address above deficits and promote optimal return to PLOF. Recommend transition to [place] upon discharge from acute hospitalization.       Follow Up Recommendations Home health PT    Equipment Recommendations  None recommended by PT    Recommendations for Other Services       Precautions / Restrictions Precautions Precautions: None Restrictions Weight Bearing Restrictions: No      Mobility  Bed Mobility Overal bed mobility: Independent             General bed mobility comments: safe technique performed  Transfers Overall transfer level: Independent Equipment used: None             General transfer comment: safe technique performed without cues. No AD required  Ambulation/Gait Ambulation/Gait assistance: Supervision Ambulation Distance (Feet): 150 Feet Assistive device: None Gait Pattern/deviations: Step-through pattern     General Gait Details: Pt demonstrates good gait speed with short step length. Small BOS noted. Pt fatigues with increased distance, however no LOB noted. Pt ambulated on 6L of O2 with sats decreasing to 88% with exertion. Once seated sats improved to 98%.   Stairs            Wheelchair  Mobility    Modified Rankin (Stroke Patients Only)       Balance Overall balance assessment: Independent                                           Pertinent Vitals/Pain Pain Assessment: No/denies pain    Home Living Family/patient expects to be discharged to:: Private residence Living Arrangements: Children Available Help at Discharge: Family Type of Home: House Home Access: Stairs to enter Entrance Stairs-Rails: Right Entrance Stairs-Number of Steps: 3 Home Layout: One level Home Equipment: None      Prior Function Level of Independence: Independent               Hand Dominance        Extremity/Trunk Assessment   Upper Extremity Assessment: Overall WFL for tasks assessed           Lower Extremity Assessment: Generalized weakness (grossly 4/5)         Communication   Communication: No difficulties  Cognition Arousal/Alertness: Awake/alert Behavior During Therapy: WFL for tasks assessed/performed Overall Cognitive Status: Within Functional Limits for tasks assessed                      General Comments      Exercises        Assessment/Plan    PT Assessment Patient needs continued  PT services  PT Diagnosis Difficulty walking;Generalized weakness   PT Problem List Decreased strength;Decreased activity tolerance  PT Treatment Interventions Gait training;Therapeutic activities   PT Goals (Current goals can be found in the Care Plan section) Acute Rehab PT Goals Patient Stated Goal: to go home PT Goal Formulation: With patient Time For Goal Achievement: 11/11/15 Potential to Achieve Goals: Good    Frequency Min 2X/week   Barriers to discharge        Co-evaluation               End of Session Equipment Utilized During Treatment: Gait belt;Oxygen Activity Tolerance: Patient tolerated treatment well Patient left: in bed Nurse Communication: Mobility status         Time: [id number] PT Time  Calculation (min) (ACUTE ONLY): 11 min   Charges:   PT Evaluation $PT Eval Low Complexity: 1 Procedure     PT G Codes:        Alonza Knisley 11/06/15, 10:44 AM  Greggory Stallion, PT, DPT [phone]

## 2015-10-28 NOTE — Discharge Summary (Signed)
Tanya Horne, 65 y.o., DOB [birth year], MRN [id number]. Admission date: 10/26/2015 Discharge Date 10/28/2015 Primary MD Patrina Levering, MD Admitting Physician Saundra Shelling, MD  Admission Diagnosis  Pulmonary fibrosis ([place]) [J84.10] SOB (shortness of breath) [R06.02] UTI (lower urinary tract infection) [N39.0] Sepsis, due to unspecified organism [name]) [A41.9]  Discharge Diagnosis   Principal Problem:   Acute on chronic respiratory failure: Due to COPD exasperation also due to patient's power running out and unable to get any oxygen throughout the electricity. Pulmonary fibrosis Tachycardia likely due to her lung disease History of gastric ulcer GERD     Hospital Course Tanya Horne is a 65 y.o. female with a known history of fibroid uterus, gastric ulcer, GERD, pulmonary fibrosis on home oxygen presented to the emergency room with difficulty breathing and her O2 saturations were low when she presented to ER. Patient said her power at home was off for the last 1 day and she cannot use her oxygen. Power bill was not paid and there is some financial hardship at home. Patient is chronically on 3 L of oxygen. And her symptoms were likely related to lack of oxygen she was initially seen in the ER and was thought to have acute sepsis but there was no evidence of sepsis. pateints breathing improved arrangements have been by casemanger with electricity company to make sure that this does not happen again. Patient is currently stable to be discharged back home.           Consults  None  Significant Tests:  See full reports for all details    Dg Chest Portable 1 View  10/26/2015  CLINICAL DATA:  Shortness of breath today. Cough. History of pulmonary fibrosis on oxygen. EXAM: PORTABLE CHEST 1 VIEW COMPARISON:  06/24/2015 FINDINGS: Diffuse pulmonary fibrosis with basilar predominance. Low lung volumes consistent with pulmonary fibrosis. No focal airspace disease or consolidation. No  blunting of costophrenic angles. No pneumothorax. Normal heart size and pulmonary vascularity. Degenerative changes in the spine. IMPRESSION: Diffuse pulmonary fibrosis with associated low lung volumes similar to prior study. No superimposed consolidation. Electronically Signed   By: Lucienne Capers M.D.   On: 10/26/2015 22:13       Today   Subjective:   [name]  feels well denies any complaints  Objective:   Blood pressure 129/79, pulse 86, temperature 98.6 F (37 C), temperature source Oral, resp. rate 20, height 4\' 11"  (1.499 m), weight 36.288 kg (80 lb), SpO2 99 %.  .  Intake/Output Summary (Last 24 hours) at 10/28/15 1436 Last data filed at 10/28/15 1249  Gross per 24 hour  Intake 1022.5 ml  Output      0 ml  Net 1022.5 ml    Exam VITAL SIGNS: Blood pressure 129/79, pulse 86, temperature 98.6 F (37 C), temperature source Oral, resp. rate 20, height 4\' 11"  (1.499 m), weight 36.288 kg (80 lb), SpO2 99 %.  GENERAL:  64 y.o.-year-old patient lying in the bed with no acute distress.  EYES: Pupils equal, round, reactive to light and accommodation. No scleral icterus. Extraocular muscles intact.  HEENT: Head atraumatic, normocephalic. Oropharynx and nasopharynx clear.  NECK:  Supple, no jugular venous distention. No thyroid enlargement, no tenderness.  LUNGS: Normal breath sounds bilaterally, no wheezing, rales,rhonchi or crepitation. No use of accessory muscles of respiration.  CARDIOVASCULAR: S1, S2 normal. No murmurs, rubs, or gallops.  ABDOMEN: Soft, nontender, nondistended. Bowel sounds present. No organomegaly or mass.  EXTREMITIES: No pedal edema, cyanosis, or clubbing.  NEUROLOGIC: Cranial nerves II through XII are intact. Muscle strength 5/5 in all extremities. Sensation intact. Gait not checked.  PSYCHIATRIC: The patient is alert and oriented x 3.  SKIN: No obvious rash, lesion, or ulcer.   Data Review     CBC w Diff: Lab Results  Component Value Date    WBC 7.3 10/27/2015   WBC 21.4* 08/26/2013   HGB 9.0* 10/27/2015   HGB 10.9* 08/26/2013   HCT 29.7* 10/27/2015   HCT 34.7* 08/26/2013   PLT 220 10/27/2015   PLT 211 08/26/2013   LYMPHOPCT 4 10/27/2015   LYMPHOPCT 2.5 08/26/2013   MONOPCT 1 10/27/2015   MONOPCT 2.0 08/26/2013   EOSPCT 1 10/27/2015   EOSPCT 0.0 08/26/2013   BASOPCT 0 10/27/2015   BASOPCT 0.2 08/26/2013   CMP: Lab Results  Component Value Date   NA 139 10/27/2015   NA 136 08/26/2013   K 4.0 10/27/2015   K 4.1 08/26/2013   CL 104 10/27/2015   CL 100 08/26/2013   CO2 28 10/27/2015   CO2 27 08/26/2013   BUN 12 10/27/2015   BUN 20* 08/26/2013   CREATININE 0.44 10/27/2015   CREATININE 0.81 08/26/2013   PROT 8.2* 10/27/2015   PROT 5.8* 08/26/2013   ALBUMIN 3.6 10/27/2015   ALBUMIN 3.5 08/26/2013   BILITOT 0.6 10/27/2015   BILITOT 1.0 08/26/2013   ALKPHOS 74 10/27/2015   ALKPHOS 75 08/26/2013   AST 24 10/27/2015   AST 16 08/26/2013   ALT 15 10/27/2015   ALT 31 08/26/2013  .  Micro Results Recent Results (from the past 240 hour(s))  Blood culture (routine x 2)     Status: None (Preliminary result)   Collection Time: 10/26/15  9:52 PM  Result Value Ref Range Status   Specimen Description BLOOD BLOOD LEFT FOREARM  Final   Special Requests BOTTLES DRAWN AEROBIC AND ANAEROBIC 5ML  Final   Culture NO GROWTH 2 DAYS  Final   Report Status PENDING  Incomplete  Blood culture (routine x 2)     Status: None (Preliminary result)   Collection Time: 10/26/15  9:52 PM  Result Value Ref Range Status   Specimen Description BLOOD RIGHT ANTECUBITAL  Final   Special Requests BOTTLES DRAWN AEROBIC AND ANAEROBIC 5ML  Final   Culture NO GROWTH 2 DAYS  Final   Report Status PENDING  Incomplete  Urine culture     Status: None   Collection Time: 10/26/15 10:53 PM  Result Value Ref Range Status   Specimen Description URINE, CLEAN CATCH  Final   Special Requests NONE  Final   Culture MULTIPLE SPECIES PRESENT, SUGGEST  RECOLLECTION  Final   Report Status 10/28/2015 FINAL  Final  Culture, blood (x 2)     Status: None (Preliminary result)   Collection Time: 10/27/15  2:33 AM  Result Value Ref Range Status   Specimen Description BLOOD RIGHT ANTECUBITAL  Final   Special Requests BOTTLES DRAWN AEROBIC AND ANAEROBIC 10ML  Final   Culture NO GROWTH 1 DAY  Final   Report Status PENDING  Incomplete  Culture, blood (x 2)     Status: None (Preliminary result)   Collection Time: 10/27/15  2:35 AM  Result Value Ref Range Status   Specimen Description BLOOD RIGHT WRIST  Final   Special Requests BOTTLES DRAWN AEROBIC AND ANAEROBIC 5ML  Final   Culture NO GROWTH 1 DAY  Final   Report Status PENDING  Incomplete        Code  Status Orders        Start     Ordered   10/27/15 0011  Full code   Continuous     10/27/15 0011    Code Status History    Date Active Date Inactive Code Status Order ID Comments User Context   08/26/2013  7:19 PM 09/08/2013 11:26 PM DNR [id number]  Juanito Doom, MD Inpatient   08/26/2013 10:12 AM 08/26/2013  7:19 PM Full Code [id number]  Fanny Skates, MD Inpatient   08/26/2013  6:54 AM 08/26/2013 10:12 AM DNR [id number]  Wilhelmina Mcardle, MD ED          Follow-up Information    Follow up with Patrina Levering, MD. Go on 11/10/2015.   Specialty:  Internal Medicine   Why:  Wednesday at 12:20pm for hospital follow-up.   Contact information:   [address] [place] [address] [phone]       Discharge Medications     Medication List    STOP taking these medications        predniSONE 5 MG tablet  Commonly known as:  DELTASONE      TAKE these medications        albuterol (2.5 MG/3ML) 0.083% nebulizer solution  Commonly known as:  PROVENTIL  Take 2.5 mg by nebulization every 6 (six) hours as needed for wheezing or shortness of breath.     diltiazem 120 MG 24 hr capsule  Commonly known as:  CARDIZEM CD  Take 1 capsule (120 mg total) by  mouth at bedtime.     lactose free nutrition Liqd  Take 237 mLs by mouth 2 (two) times daily between meals.     pantoprazole 40 MG tablet  Commonly known as:  PROTONIX  Take 1 tablet (40 mg total) by mouth 2 (two) times daily before a meal.           Total Time in preparing paper work, data evaluation and todays exam - 35 minutes  Dustin Flock M.D on 10/28/2015 at 2:36 PM  [place]  [phone]

## 2015-10-28 NOTE — Progress Notes (Signed)
Ms. Heyam, Presha is Oxygen dependent and can not survive without oxygen therapy.  It is essential that her electricity is not cutoff.

## 2015-10-28 NOTE — Progress Notes (Signed)
Patient A/O, no noted distress. Denies any pain. Slept through out the night. Cont b/B. Plan to discharge today. Staff will continue to meet needs and s

## 2015-10-28 NOTE — Discharge Instructions (Signed)
°  DIET:  Cardiac diet  DISCHARGE CONDITION:  Stable  ACTIVITY:  Activity as tolerated  OXYGEN:  Home Oxygen: Yes.     Oxygen Delivery: 4 liters/min via Patient connected to nasal cannula oxygen  DISCHARGE LOCATION:  home    ADDITIONAL DISCHARGE INSTRUCTION:   If you experience worsening of your admission symptoms, develop shortness of breath, life threatening emergency, suicidal or homicidal thoughts you must seek medical attention immediately by calling 911 or calling your MD immediately  if symptoms less severe.  You Must read complete instructions/literature along with all the possible adverse reactions/side effects for all the Medicines you take and that have been prescribed to you. Take any new Medicines after you have completely understood and accpet all the possible adverse reactions/side effects.   Please note  You were cared for by a hospitalist during your hospital stay. If you have any questions about your discharge medications or the care you received while you were in the hospital after you are discharged, you can call the unit and asked to speak with the hospitalist on call if the hospitalist that took care of you is not available. Once you are discharged, your primary care physician will handle any further medical issues. Please note that NO REFILLS for any discharge medications will be authorized once you are discharged, as it is imperative that you return to your primary care physician (or establish a relationship with a primary care physician if you do not have one) for your aftercare needs so that they can reassess your need for medications and monitor your lab values.

## 2015-10-31 LAB — CULTURE, BLOOD (ROUTINE X 2)
CULTURE: NO GROWTH
Culture: NO GROWTH

## 2015-11-01 LAB — CULTURE, BLOOD (ROUTINE X 2)
CULTURE: NO GROWTH
CULTURE: NO GROWTH

## 2016-12-31 ENCOUNTER — Observation Stay
Admission: EM | Admit: 2016-12-31 | Discharge: 2017-01-01 | Disposition: A | Discharge status: Home / Self Care | Payer: Medicare Other | Attending: Internal Medicine | Admitting: Internal Medicine

## 2016-12-31 ENCOUNTER — Emergency Department: Payer: Medicare Other

## 2016-12-31 ENCOUNTER — Encounter: Payer: Self-pay | Admitting: *Deleted

## 2016-12-31 DIAGNOSIS — Z800 Family history of malignant neoplasm of digestive organs: Secondary | ICD-10-CM | POA: Diagnosis not present

## 2016-12-31 DIAGNOSIS — R0602 Shortness of breath: Secondary | ICD-10-CM

## 2016-12-31 DIAGNOSIS — G4700 Insomnia, unspecified: Secondary | ICD-10-CM | POA: Insufficient documentation

## 2016-12-31 DIAGNOSIS — N390 Urinary tract infection, site not specified: Secondary | ICD-10-CM | POA: Insufficient documentation

## 2016-12-31 DIAGNOSIS — R05 Cough: Secondary | ICD-10-CM | POA: Diagnosis not present

## 2016-12-31 DIAGNOSIS — Z8249 Family history of ischemic heart disease and other diseases of the circulatory system: Secondary | ICD-10-CM | POA: Insufficient documentation

## 2016-12-31 DIAGNOSIS — Z882 Allergy status to sulfonamides status: Secondary | ICD-10-CM | POA: Insufficient documentation

## 2016-12-31 DIAGNOSIS — Z79899 Other long term (current) drug therapy: Secondary | ICD-10-CM | POA: Insufficient documentation

## 2016-12-31 DIAGNOSIS — Z9981 Dependence on supplemental oxygen: Secondary | ICD-10-CM | POA: Insufficient documentation

## 2016-12-31 DIAGNOSIS — I340 Nonrheumatic mitral (valve) insufficiency: Secondary | ICD-10-CM | POA: Diagnosis not present

## 2016-12-31 DIAGNOSIS — R079 Chest pain, unspecified: Secondary | ICD-10-CM | POA: Diagnosis not present

## 2016-12-31 DIAGNOSIS — J8410 Pulmonary fibrosis, unspecified: Secondary | ICD-10-CM | POA: Insufficient documentation

## 2016-12-31 DIAGNOSIS — F329 Major depressive disorder, single episode, unspecified: Secondary | ICD-10-CM | POA: Diagnosis not present

## 2016-12-31 DIAGNOSIS — Z87891 Personal history of nicotine dependence: Secondary | ICD-10-CM | POA: Diagnosis not present

## 2016-12-31 DIAGNOSIS — D509 Iron deficiency anemia, unspecified: Secondary | ICD-10-CM | POA: Insufficient documentation

## 2016-12-31 DIAGNOSIS — J9620 Acute and chronic respiratory failure, unspecified whether with hypoxia or hypercapnia: Secondary | ICD-10-CM | POA: Insufficient documentation

## 2016-12-31 DIAGNOSIS — K219 Gastro-esophageal reflux disease without esophagitis: Secondary | ICD-10-CM | POA: Diagnosis not present

## 2016-12-31 DIAGNOSIS — Z8711 Personal history of peptic ulcer disease: Secondary | ICD-10-CM | POA: Insufficient documentation

## 2016-12-31 DIAGNOSIS — Z681 Body mass index (BMI) 19 or less, adult: Secondary | ICD-10-CM | POA: Insufficient documentation

## 2016-12-31 DIAGNOSIS — Z515 Encounter for palliative care: Secondary | ICD-10-CM

## 2016-12-31 DIAGNOSIS — E43 Unspecified severe protein-calorie malnutrition: Secondary | ICD-10-CM | POA: Diagnosis not present

## 2016-12-31 DIAGNOSIS — Z806 Family history of leukemia: Secondary | ICD-10-CM | POA: Diagnosis not present

## 2016-12-31 DIAGNOSIS — F419 Anxiety disorder, unspecified: Secondary | ICD-10-CM | POA: Insufficient documentation

## 2016-12-31 DIAGNOSIS — E2740 Unspecified adrenocortical insufficiency: Secondary | ICD-10-CM | POA: Diagnosis not present

## 2016-12-31 LAB — COMPREHENSIVE METABOLIC PANEL
ALBUMIN: 3.7 g/dL (ref 3.5–5.0)
ALK PHOS: 49 U/L (ref 38–126)
ALT: 16 U/L (ref 14–54)
ANION GAP: 7 (ref 5–15)
AST: 32 U/L (ref 15–41)
BUN: 32 mg/dL — ABNORMAL HIGH (ref 6–20)
CO2: 34 mmol/L — AB (ref 22–32)
Calcium: 9 mg/dL (ref 8.9–10.3)
Chloride: 99 mmol/L — ABNORMAL LOW (ref 101–111)
Creatinine, Ser: 0.61 mg/dL (ref 0.44–1.00)
GFR calc Af Amer: >60 mL/min (ref >60)
GFR calc non Af Amer: >60 mL/min (ref >60)
GLUCOSE: 125 mg/dL — AB (ref 65–99)
POTASSIUM: 4.1 mmol/L (ref 3.5–5.1)
SODIUM: 140 mmol/L (ref 135–145)
Total Bilirubin: 0.3 mg/dL (ref 0.3–1.2)
Total Protein: 7.9 g/dL (ref 6.5–8.1)

## 2016-12-31 LAB — CBC
HCT: 27.4 % — ABNORMAL LOW (ref 35.0–47.0)
HEMOGLOBIN: 7.9 g/dL — AB (ref 12.0–16.0)
MCH: 21.6 pg — ABNORMAL LOW (ref 26.0–34.0)
MCHC: 28.8 g/dL — AB (ref 32.0–36.0)
MCV: 75.1 fL — ABNORMAL LOW (ref 80.0–100.0)
Platelets: 261 10*3/uL (ref 150–440)
RBC: 3.65 MIL/uL — ABNORMAL LOW (ref 3.80–5.20)
RDW: 19.9 % — AB (ref 11.5–14.5)
WBC: 7.3 10*3/uL (ref 3.6–11.0)

## 2016-12-31 LAB — TROPONIN I: Troponin I: 0.03 ng/mL (ref <0.03)

## 2016-12-31 LAB — BRAIN NATRIURETIC PEPTIDE: B Natriuretic Peptide: 155 pg/mL — ABNORMAL HIGH (ref 0.0–100.0)

## 2016-12-31 MED ORDER — METHYLPREDNISOLONE SODIUM SUCC 125 MG IJ SOLR
125.0000 mg | Freq: Once | INTRAMUSCULAR | Status: AC
  Administered 2016-12-31: 125 mg via INTRAVENOUS
  Filled 2016-12-31: qty 2

## 2016-12-31 MED ORDER — IPRATROPIUM-ALBUTEROL 0.5-2.5 (3) MG/3ML IN SOLN
3.0000 mL | Freq: Once | RESPIRATORY_TRACT | Status: AC
  Administered 2016-12-31: 3 mL via RESPIRATORY_TRACT
  Filled 2016-12-31: qty 3

## 2016-12-31 NOTE — ED Provider Notes (Signed)
[facility] Emergency Department Provider Note   ____________________________________________    I have reviewed the triage vital signs and the nursing notes.   HISTORY  Chief Complaint Shortness of Breath     HPI Tanya Horne is a 66 y.o. female who presents with complaints of shortness of breath. Patient is a history of severe pulmonary fibrosis but over the last 2-3 days she has felt significant worse. She is on 5 L of home oxygen at baseline. She denies fevers or chills. Positive cough, nonproductive. No recent travel. No calf pain or swelling. No nausea or vomiting. No diaphoresis or chest pain.   Past Medical History:  Diagnosis Date  . Fibroid   . Gastric ulcer   . GERD (gastroesophageal reflux disease)   . Pulmonary fibrosis [facility])     Patient Active Problem List   Diagnosis Date Noted  . Sepsis ([place]) 10/27/2015  . UTI (lower urinary tract infection) 10/27/2015  . Hypoxia 10/26/2015  . Acute-on-chronic respiratory failure ([place]) 08/26/2013  . Pre-operative respiratory examination 08/26/2013  . Chronic use of steroids 08/26/2013  . Adrenal insufficiency ([place]) 08/26/2013  . Pulmonary fibrosis ([place]) 08/26/2013  . Severe hypoxemia 08/26/2013  . Perforated gastric ulcer ([place]) 08/26/2013    Past Surgical History:  Procedure Laterality Date  . APPENDECTOMY    . CESAREAN SECTION    . LAPAROTOMY  08/26/2013   Procedure: EXPLORATORY LAPAROTOMY closure of gastric ulcer;  Surgeon: Adin Hector, MD;  Location: [facility]S;  Service: General;;  . LUNG BIOPSY      Prior to Admission medications   Medication Sig Start Date End Date Taking? Authorizing Provider  albuterol (PROVENTIL) (2.5 MG/3ML) 0.083% nebulizer solution Take 2.5 mg by nebulization every 6 (six) hours as needed for wheezing or shortness of breath.    Historical Provider, MD  diltiazem (CARDIZEM CD) 120 MG 24 hr capsule Take 1 capsule (120 mg total) by mouth at bedtime.  10/28/15   Dustin Flock, MD  lactose free nutrition (BOOST PLUS) LIQD Take 237 mLs by mouth 2 (two) times daily between meals. 09/08/13   Erick Colace, NP  pantoprazole (PROTONIX) 40 MG tablet Take 1 tablet (40 mg total) by mouth 2 (two) times daily before a meal. 09/08/13   Erick Colace, NP     Allergies Sulfa antibiotics  Family History  Problem Relation Age of Onset  . Cancer Sister     lymphoma, leukemia  . Cancer Brother     pancreatic  . Heart disease Brother     Social History Social History  Substance Use Topics  . Smoking status: Former Research scientist (life sciences)  . Smokeless tobacco: Never Used  . Alcohol use No    Review of Systems  Constitutional: No fever/chills Eyes: No visual changes.  ENT: No Throat swelling Cardiovascular: Denies chest pain. Respiratory: As above Gastrointestinal: No abdominal pain.  No nausea, no vomiting.   Genitourinary: Negative for dysuria. Musculoskeletal: Negative for back pain. Skin: Negative for rash. Neurological: Negative for headaches   10-point ROS otherwise negative.  ____________________________________________   PHYSICAL EXAM:  VITAL SIGNS: ED Triage Vitals  Enc Vitals Group     BP 12/31/16 1933 111/78     Pulse Rate 12/31/16 2045 (!) 113     Resp 12/31/16 1933 (!) 37     Temp 12/31/16 1933 97.4 F (36.3 C)     Temp Source 12/31/16 1933 Oral     SpO2 12/31/16 2045 100 %  Weight 12/31/16 1934 75 lb (34 kg)     Height 12/31/16 1934 4\' 11"  (1.499 m)     Head Circumference --      Peak Flow --      Pain Score --      Pain Loc --      Pain Edu? --      Excl. in [place]? --     Constitutional: Alert and oriented. Moderate distress. Cachectic Eyes: Conjunctivae are normal.   Nose: No congestion/rhinnorhea. Mouth/Throat: Mucous membranes are moist.   Neck:  Painless ROM Cardiovascular: Tachycardia regular rhythm. Grossly normal heart sounds.  Good peripheral circulation. Respiratory: Increased respiratory effort with  tachypnea, scattered rales, scattered wheezes Gastrointestinal: Soft and nontender. No distention.  No CVA tenderness. Genitourinary: deferred Musculoskeletal: No lower extremity tenderness nor edema.  Warm and well perfused Neurologic:  Normal speech and language. No gross focal neurologic deficits are appreciated.  Skin:  Skin is warm, dry and intact. No rash noted. Psychiatric: Mood and affect are normal. Speech and behavior are normal.  ____________________________________________   LABS (all labs ordered are listed, but only abnormal results are displayed)  Labs Reviewed  CBC - Abnormal; Notable for the following:       Result Value   RBC 3.65 (*)    Hemoglobin 7.9 (*)    HCT 27.4 (*)    MCV 75.1 (*)    MCH 21.6 (*)    MCHC 28.8 (*)    RDW 19.9 (*)    All other components within normal limits  COMPREHENSIVE METABOLIC PANEL - Abnormal; Notable for the following:    Chloride 99 (*)    CO2 34 (*)    Glucose, Bld 125 (*)    BUN 32 (*)    All other components within normal limits  TROPONIN I - Abnormal; Notable for the following:    Troponin I 0.03 (*)    All other components within normal limits  BRAIN NATRIURETIC PEPTIDE - Abnormal; Notable for the following:    B Natriuretic Peptide 155.0 (*)    All other components within normal limits   ____________________________________________  EKG  ED ECG REPORT I, Lavonia Drafts, the attending physician, personally viewed and interpreted this ECG.  Date: 12/31/2016  Rhythm: Tachycardia QRS Axis: normal Intervals: normal ST/T Wave abnormalities: normal Conduction Disturbances: none Narrative Interpretation: Tachycardia consistent with respiratory distress  ____________________________________________  RADIOLOGY  Chest x-ray shows worsening pulmonary fibrosis ____________________________________________   PROCEDURES  Procedure(s) performed: No    Critical Care performed:  No ____________________________________________   INITIAL IMPRESSION / ASSESSMENT AND PLAN / ED COURSE  Pertinent labs & imaging results that were available during my care of the patient were reviewed by me and considered in my medical decision making (see chart for details).  Patient presents with shortness of breath, likely related to exacerbation of pulmonary fibrosis. Chest x-ray does not show evidence of pneumonia, labs are reassuring overall. Patient treated with DuoNeb and single dose of Solu-Medrol. She will require admission for further management    ____________________________________________   FINAL CLINICAL IMPRESSION(S) / ED DIAGNOSES  Final diagnoses:  SOB (shortness of breath)  Pulmonary fibrosis (HCC)      NEW MEDICATIONS STARTED DURING THIS VISIT:  New Prescriptions   No medications on file     Note:  This document was prepared using Dragon voice recognition software and may include unintentional dictation errors.    Lavonia Drafts, MD 12/31/16 2158

## 2016-12-31 NOTE — Progress Notes (Signed)
Family Meeting Note  Advance Directive:yes  Today a meeting took place with the Patient and son.  The following clinical team members were present during this meeting:MD  The following were discussed:Patient's diagnosis:severe pulm fibrosis, malnutrition , Patient's progosis: Unable to determine and Goals for treatment: DNR  Additional follow-up to be provided: PMD  Time spent during discussion:20 minutes  Tanya Horne, Rosalio Macadamia, MD

## 2016-12-31 NOTE — H&P (Signed)
[place] [facility] [place] NAME: Tanya Horne    MR#:  [id number]  DATE OF BIRTH:  1950/12/10  DATE OF ADMISSION:  12/31/2016  PRIMARY CARE PHYSICIAN: Patrina Levering, MD   REQUESTING/REFERRING PHYSICIAN: [place]  CHIEF COMPLAINT:   Chief Complaint  Patient presents with  . Shortness of Breath    HISTORY OF PRESENT ILLNESS: Tanya Horne  is a 66 y.o. female with a known history of Chronic pulmonary fibrosis, as per son who was in the room patient follows at [facility] pulmonology clinic and she was taken off from all the medication for pulmonary and currently just using oxygen at home and as needed nebulizer treatments. She had some chest pain which was going on the left side along with tightness in her chest today at home and so she came to emergency room, she denies any cough or wheezing. Her son who is also present in the room and is in argumenting state with the patient says that it was all panic attack. He also confirmed that similar episodes happen in the past and because of her overall very poor health she ends up getting admitted to the hospital. I confirmed with her son as per him at [facility] they tried all the treatment of her lungs but finally suggested to stop everything and this is her baseline now so he suggested not to start IV steroids on her.  PAST MEDICAL HISTORY:   Past Medical History:  Diagnosis Date  . Fibroid   . Gastric ulcer   . GERD (gastroesophageal reflux disease)   . Pulmonary fibrosis ([place])     PAST SURGICAL HISTORY: Past Surgical History:  Procedure Laterality Date  . APPENDECTOMY    . CESAREAN SECTION    . LAPAROTOMY  08/26/2013   Procedure: EXPLORATORY LAPAROTOMY closure of gastric ulcer;  Surgeon: Adin Hector, MD;  Location: [facility];  Service: General;;  . LUNG BIOPSY      SOCIAL HISTORY:  Social History  Substance Use Topics  . Smoking status: Former Research scientist (life sciences)  . Smokeless tobacco: Never Used  . Alcohol use No     FAMILY HISTORY:  Family History  Problem Relation Age of Onset  . Cancer Sister     lymphoma, leukemia  . Cancer Brother     pancreatic  . Heart disease Brother     DRUG ALLERGIES:  Allergies  Allergen Reactions  . Sulfa Antibiotics Hives    REVIEW OF SYSTEMS:   CONSTITUTIONAL: No fever, fatigue or weakness.  EYES: No blurred or double vision.  EARS, NOSE, AND THROAT: No tinnitus or ear pain.  RESPIRATORY: No cough, shortness of breath, wheezing or hemoptysis.  CARDIOVASCULAR: Positive for chest pain, no orthopnea, edema.  GASTROINTESTINAL: No nausea, vomiting, diarrhea or abdominal pain.  GENITOURINARY: No dysuria, hematuria.  ENDOCRINE: No polyuria, nocturia,  HEMATOLOGY: No anemia, easy bruising or bleeding SKIN: No rash or lesion. MUSCULOSKELETAL: No joint pain or arthritis.   NEUROLOGIC: No tingling, numbness, weakness.  PSYCHIATRY: No anxiety or depression.   MEDICATIONS AT HOME:  Prior to Admission medications   Medication Sig Start Date End Date Taking? Authorizing Provider  ibuprofen (ADVIL,MOTRIN) 200 MG tablet Take 200 mg by mouth every 6 (six) hours as needed.   Yes Historical Provider, MD  loratadine (CLARITIN) 10 MG tablet Take 10 mg by mouth daily as needed for allergies.   Yes Historical Provider, MD  albuterol (PROVENTIL) (2.5 MG/3ML) 0.083% nebulizer solution Take 2.5 mg by nebulization  every 6 (six) hours as needed for wheezing or shortness of breath.    Historical Provider, MD  diltiazem (CARDIZEM CD) 120 MG 24 hr capsule Take 1 capsule (120 mg total) by mouth at bedtime. Patient not taking: Reported on 12/31/2016 10/28/15   Dustin Flock, MD  lactose free nutrition (BOOST PLUS) LIQD Take 237 mLs by mouth 2 (two) times daily between meals. Patient not taking: Reported on 12/31/2016 09/08/13   Erick Colace, NP  pantoprazole (PROTONIX) 40 MG tablet Take 1 tablet (40 mg total) by mouth 2 (two) times daily before a meal. Patient not taking: Reported on  12/31/2016 09/08/13   Erick Colace, NP      PHYSICAL EXAMINATION:   VITAL SIGNS: Blood pressure 105/79, pulse (!) 107, temperature 97.4 F (36.3 C), temperature source Oral, resp. rate (!) 24, height 4\' 11"  (1.499 m), weight 34 kg (75 lb), SpO2 100 %.  GENERAL:  66 y.o.-year-old Severely cachectic patient lying in the bed with no acute distress.  EYES: Pupils equal, round, reactive to light and accommodation. No scleral icterus. Extraocular muscles intact.  HEENT: Head atraumatic, normocephalic. Oropharynx and nasopharynx clear.  NECK:  Supple, no jugular venous distention. No thyroid enlargement, no tenderness.  LUNGS: Decreased breath sounds bilaterally, some wheezing, no crepitation. Positive use of accessory muscles of respiration.  CARDIOVASCULAR: S1, S2 normal. No murmurs, rubs, or gallops.  ABDOMEN: Soft, nontender, nondistended. Bowel sounds present. No organomegaly or mass.  EXTREMITIES: No pedal edema, cyanosis, or clubbing.  NEUROLOGIC: Cranial nerves II through XII are intact. Muscle strength 5/5 in all extremities. Sensation intact. Gait not checked.  PSYCHIATRIC: The patient is alert and oriented x 3.  SKIN: No obvious rash, lesion, or ulcer.   LABORATORY PANEL:   CBC  Recent Labs Lab 12/31/16 2009  WBC 7.3  HGB 7.9*  HCT 27.4*  PLT 261  MCV 75.1*  MCH 21.6*  MCHC 28.8*  RDW 19.9*   ------------------------------------------------------------------------------------------------------------------  Chemistries   Recent Labs Lab 12/31/16 2009  NA 140  K 4.1  CL 99*  CO2 34*  GLUCOSE 125*  BUN 32*  CREATININE 0.61  CALCIUM 9.0  AST 32  ALT 16  ALKPHOS 49  BILITOT 0.3   ------------------------------------------------------------------------------------------------------------------ estimated creatinine clearance is 37.1 mL/min (by C-G formula based on SCr of 0.61  mg/dL). ------------------------------------------------------------------------------------------------------------------ No results for input(s): TSH, T4TOTAL, T3FREE, THYROIDAB in the last 72 hours.  Invalid input(s): FREET3   Coagulation profile No results for input(s): INR, PROTIME in the last 168 hours. ------------------------------------------------------------------------------------------------------------------- No results for input(s): DDIMER in the last 72 hours. -------------------------------------------------------------------------------------------------------------------  Cardiac Enzymes  Recent Labs Lab 12/31/16 2009  TROPONINI 0.03*   ------------------------------------------------------------------------------------------------------------------ Invalid input(s): POCBNP  ---------------------------------------------------------------------------------------------------------------  Urinalysis    Component Value Date/Time   COLORURINE YELLOW (A) 10/26/2015 2253   APPEARANCEUR HAZY (A) 10/26/2015 2253   LABSPEC 1.029 10/26/2015 2253   PHURINE 5.0 10/26/2015 2253   GLUCOSEU NEGATIVE 10/26/2015 2253   HGBUR 2+ (A) 10/26/2015 2253   BILIRUBINUR NEGATIVE 10/26/2015 2253   KETONESUR NEGATIVE 10/26/2015 2253   PROTEINUR 100 (A) 10/26/2015 2253   NITRITE NEGATIVE 10/26/2015 2253   LEUKOCYTESUR 3+ (A) 10/26/2015 2253     RADIOLOGY: Dg Chest 1 View  Result Date: 12/31/2016 CLINICAL DATA:  Worsening dyspnea beginning 2 days ago, increased fatigue, shortness of breath, depression and weakness ; history pulmonary fibrosis, GERD, gastric ulcer EXAM: CHEST 1 VIEW COMPARISON:  Portable exam 1940 hours compared to 10/26/2015 FINDINGS: Borderline enlargement of cardiac silhouette.  Mediastinal contours and pulmonary vascularity normal. Severe diffuse interstitial changes likely representing pulmonary fibrosis, slightly increased since the previous exam particularly in  the LEFT upper lobe. No definite acute superimposed infiltrate, pleural effusion or pneumothorax. Postsurgical changes at LEFT lung base. Bones demineralized. IMPRESSION: Progressive pulmonary fibrosis. No acute abnormalities. Electronically Signed   By: Lavonia Dana M.D.   On: 12/31/2016 20:29    EKG: Orders placed or performed during the hospital encounter of 12/31/16  . ED EKG  . ED EKG    IMPRESSION AND PLAN:  * Chest pain   Because of her severe pulmonary disease we will keep her on telemetry and follow serial troponin to rule out cardiac event.   Most likely pain may be from GI origin.  * Severe pulmonary fibrosis   Not a candidate for any further treatment as per her son and this is her baseline, so I will not give IV steroid are just give nebulizer treatments.  * Microcytic anemia   Continue monitoring.  * Severe malnutrition   Continue monitoring.  All the records are reviewed and case discussed with ED provider. Management plans discussed with the patient, family and they are in agreement.  CODE STATUS: DO NOT RESUSCITATE Code Status History    Date Active Date Inactive Code Status Order ID Comments User Context   10/27/2015 12:11 AM 10/28/2015  4:24 PM Full Code [id number]  Saundra Shelling, MD ED   08/26/2013  7:19 PM 09/08/2013 11:26 PM DNR [id number]  Juanito Doom, MD Inpatient   08/26/2013 10:12 AM 08/26/2013  7:19 PM Full Code [id number]  Fanny Skates, MD Inpatient   08/26/2013  6:54 AM 08/26/2013 10:12 AM DNR [id number]  Wilhelmina Mcardle, MD ED     Patient's son is present in the room discussed with him.  TOTAL TIME TAKING CARE OF THIS PATIENT: 50 minutes.    Vaughan Basta M.D on 12/31/2016   Between 7am to 6pm - Pager - [phone]  After 6pm go to [email or web address] - password [email or web address] [place] [facility]  Office  [phone]  CC: Primary care physician; Patrina Levering, MD   Note: This dictation was prepared with Dragon dictation along  with smaller phrase technology. Any transcriptional errors that result from this process are unintentional.

## 2016-12-31 NOTE — ED Triage Notes (Signed)
Pt c/o worsening dyspnea starting x 2 days ago. Pt states she has had increased fatigue, depression, and weakness as well as increased shortness of breath.

## 2017-01-01 ENCOUNTER — Encounter: Payer: Self-pay | Admitting: *Deleted

## 2017-01-01 ENCOUNTER — Observation Stay (HOSPITAL_BASED_OUTPATIENT_CLINIC_OR_DEPARTMENT_OTHER)
Admit: 2017-01-01 | Discharge: 2017-01-01 | Disposition: A | Discharge status: Home / Self Care | Payer: Medicare Other | Attending: Internal Medicine | Admitting: Internal Medicine

## 2017-01-01 DIAGNOSIS — J8410 Pulmonary fibrosis, unspecified: Secondary | ICD-10-CM

## 2017-01-01 DIAGNOSIS — R0602 Shortness of breath: Secondary | ICD-10-CM | POA: Diagnosis not present

## 2017-01-01 DIAGNOSIS — Z515 Encounter for palliative care: Secondary | ICD-10-CM

## 2017-01-01 DIAGNOSIS — R079 Chest pain, unspecified: Secondary | ICD-10-CM

## 2017-01-01 LAB — BASIC METABOLIC PANEL
Anion gap: 4 — ABNORMAL LOW (ref 5–15)
BUN: 31 mg/dL — ABNORMAL HIGH (ref 6–20)
CHLORIDE: 98 mmol/L — AB (ref 101–111)
CO2: 35 mmol/L — AB (ref 22–32)
Calcium: 8.7 mg/dL — ABNORMAL LOW (ref 8.9–10.3)
Creatinine, Ser: 0.49 mg/dL (ref 0.44–1.00)
GFR calc Af Amer: >60 mL/min (ref >60)
GFR calc non Af Amer: >60 mL/min (ref >60)
GLUCOSE: 102 mg/dL — AB (ref 65–99)
POTASSIUM: 4.5 mmol/L (ref 3.5–5.1)
Sodium: 137 mmol/L (ref 135–145)

## 2017-01-01 LAB — ECHOCARDIOGRAM COMPLETE
HEIGHTINCHES: 59 in
WEIGHTICAEL: 1200 [oz_av]

## 2017-01-01 LAB — CBC
HCT: 25.4 % — ABNORMAL LOW (ref 35.0–47.0)
Hemoglobin: 7.5 g/dL — ABNORMAL LOW (ref 12.0–16.0)
MCH: 21.2 pg — ABNORMAL LOW (ref 26.0–34.0)
MCHC: 29.3 g/dL — AB (ref 32.0–36.0)
MCV: 72.4 fL — AB (ref 80.0–100.0)
Platelets: 213 10*3/uL (ref 150–440)
RBC: 3.51 MIL/uL — ABNORMAL LOW (ref 3.80–5.20)
RDW: 20.4 % — AB (ref 11.5–14.5)
WBC: 2.6 10*3/uL — ABNORMAL LOW (ref 3.6–11.0)

## 2017-01-01 LAB — TROPONIN I
Troponin I: <0.03 ng/mL (ref <0.03)
Troponin I: <0.03 ng/mL (ref <0.03)

## 2017-01-01 MED ORDER — LORAZEPAM 2 MG/ML PO CONC: 0.6000 mg | Freq: Two times a day (BID) | ORAL | 0 refills | Status: AC

## 2017-01-01 MED ORDER — LORATADINE 10 MG PO TABS
10.0000 mg | ORAL_TABLET | Freq: Every day | ORAL | Status: DC | PRN
Start: 2017-01-01 — End: 2017-01-01

## 2017-01-01 MED ORDER — DOCUSATE SODIUM 100 MG PO CAPS: 100.0000 mg | ORAL_CAPSULE | Freq: Two times a day (BID) | ORAL | Status: DC | PRN

## 2017-01-01 MED ORDER — LORAZEPAM 2 MG/ML PO CONC: 0.5000 mg | Freq: Two times a day (BID) | ORAL | Status: DC | PRN

## 2017-01-01 MED ORDER — IBUPROFEN 400 MG PO TABS: 200.0000 mg | ORAL_TABLET | Freq: Four times a day (QID) | ORAL | Status: DC | PRN

## 2017-01-01 MED ORDER — PANTOPRAZOLE SODIUM 40 MG PO TBEC
40.0000 mg | DELAYED_RELEASE_TABLET | Freq: Every day | ORAL | Status: DC
  Administered 2017-01-01: 40 mg via ORAL
  Filled 2017-01-01: qty 1

## 2017-01-01 MED ORDER — HEPARIN SODIUM (PORCINE) 5000 UNIT/ML IJ SOLN
5000.0000 [IU] | Freq: Three times a day (TID) | INTRAMUSCULAR | Status: DC
  Administered 2017-01-01 (×2): 5000 [IU] via SUBCUTANEOUS
  Filled 2017-01-01 (×2): qty 1

## 2017-01-01 MED ORDER — ALBUTEROL SULFATE (2.5 MG/3ML) 0.083% IN NEBU: 2.5000 mg | INHALATION_SOLUTION | Freq: Four times a day (QID) | RESPIRATORY_TRACT | Status: DC | PRN

## 2017-01-01 MED ORDER — MORPHINE SULFATE 10 MG/5ML PO SOLN: 2.5000 mg | ORAL | 0 refills | Status: AC | PRN

## 2017-01-01 MED ORDER — MORPHINE SULFATE 10 MG/5ML PO SOLN: 2.5000 mg | ORAL | Status: DC | PRN

## 2017-01-01 MED ORDER — SODIUM CHLORIDE 0.9% FLUSH
3.0000 mL | Freq: Two times a day (BID) | INTRAVENOUS | Status: DC
  Administered 2017-01-01 (×2): 3 mL via INTRAVENOUS

## 2017-01-01 NOTE — Consult Note (Signed)
Consultation Note Date: 01/01/2017   Patient Name: Tanya Horne  DOB: Oct 17, 1950  MRN: [id number]  Age / Sex: 66 y.o., female  PCP: Patrina Levering, MD Referring Physician: Demetrios Loll, MD  Reason for Consultation: Establishing goals of care  HPI/Patient Profile: 66 y.o. female  with past medical history of pulmonary fibrosis, gastric ulcer, and malnutrition who was admitted on 12/31/2016 with chest pain.  Her pulmonary fibrosis has been cared for at [facility].  Not long ago she decided to discontinue all of her pulmonary fibrosis medications and her pulmonologist supported this decision.  She has lost 60 of her 135 lbs.  With regard to her chest pain, troponins were WNL, chest xray showed progressive pulmonary fibrosis.  She is on a PPI. The patient was admitted for observation.  Clinical Assessment and Goals of Care:  I met with the patient and her son at bedside.  I asked what they understood.  The patient replied, "I'm dying, its just a matter of when".  We proceeded to have a discussion about care at home and her life style.  She asks multiple times if I have something that will make her breath better.  She states she hates being so sedentary - she would rather be dead. Despite this the patient is pleasant and cooperative.  She has been on hospice before ([place]) and would like to have them again.  We discussed symptoms - she can not sleep and has significant anxiety as well as dyspnea.   We also discussed not returning to the hospital again unless she is unable to be made comfortable at home. He son, Glennon Mac stepped outside the room with me.  He is her primary care taker and obviously under a lot of stress.  He seems to be doing an excellent job taking care of her.  We discussed Glennon Mac looking into the future to consider life after his mother's illness.  Primary Decision Maker:  PATIENT    SUMMARY  OF RECOMMENDATIONS    Home with Hospice Services  Code Status/Advance Care Planning:  DNR    Symptom Management:   Morphine for SOB or pain  Ativan for anxiety, insomnia  Family aware of need for bowel regimen with morphine use.  Additional Recommendations (Limitations, Scope, Preferences):  Full Comfort Care  Palliative Prophylaxis:   Frequent Pain Assessment  Psycho-social/Spiritual:   Desire for further Chaplaincy support: yes  Prognosis:   < 3 months given advanced pulmonary fibrosis, cachexia, tachycardia and weight loss. On 6L of oxygen.  Discharge Planning: Home with Hospice      Primary Diagnoses: Present on Admission: **None**   I have reviewed the medical record, interviewed the patient and family, and examined the patient. The following aspects are pertinent.  Past Medical History:  Diagnosis Date  . Fibroid   . Gastric ulcer   . GERD (gastroesophageal reflux disease)   . Pulmonary fibrosis [facility])    Social History   Social History  . Marital status: Widowed    Spouse name:  N/A  . Number of children: N/A  . Years of education: N/A   Occupational History  . retired    Social History Main Topics  . Smoking status: Former Research scientist (life sciences)  . Smokeless tobacco: Never Used  . Alcohol use No  . Drug use: No  . Sexual activity: No   Other Topics Concern  . None   Social History Narrative  . None   Family History  Problem Relation Age of Onset  . Cancer Sister     lymphoma, leukemia  . Cancer Brother     pancreatic  . Heart disease Brother    Scheduled Meds: . heparin  5,000 Units Subcutaneous Q8H  . pantoprazole  40 mg Oral Daily  . sodium chloride flush  3 mL Intravenous Q12H   Continuous Infusions: PRN Meds:.albuterol, docusate sodium, ibuprofen, loratadine, LORazepam, morphine Allergies  Allergen Reactions  . Sulfa Antibiotics Hives   Review of Systems dyspnea, anxiety, insomnia, anorexia  Physical Exam  Frail, cachectic,  chronically ill appearing female, A&O with clear speech. CV tachycardic Resp decreased breath sounds Abdomen thin, nt Ext no edema  Vital Signs: BP 104/80 (BP Location: Left Arm)   Pulse (!) 107   Temp 97.9 F (36.6 C) (Oral)   Resp 16   Ht 4' 11"  (1.499 m)   Wt 34 kg (75 lb)   SpO2 100%   BMI 15.15 kg/m  Pain Assessment: No/denies pain     SpO2: SpO2: 100 % O2 Device:SpO2: 100 % O2 Flow Rate: .O2 Flow Rate (L/min): 5 L/min  IO: Intake/output summary:  Intake/Output Summary (Last 24 hours) at 01/01/17 1328 Last data filed at 01/01/17 1009  Gross per 24 hour  Intake              603 ml  Output                0 ml  Net              603 ml    LBM: Last BM Date: 12/31/16 Baseline Weight: Weight: 34 kg (75 lb) Most recent weight: Weight: 34 kg (75 lb)     Palliative Assessment/Data:   Flowsheet Rows     Most Recent Value  Intake Tab  Referral Department  Hospitalist  Unit at Time of Referral  Med/Surg Unit  Palliative Care Primary Diagnosis  Pulmonary  Date Notified  01/01/17  Palliative Care Type  New Palliative care  Reason for referral  Clarify Goals of Care  Date of Admission  12/31/16  Date first seen by Palliative Care  01/01/17  # of days Palliative referral response time  0 Day(s)  # of days IP prior to Palliative referral  1  Clinical Assessment  Palliative Performance Scale Score  30%  Dyspnea Max Last 24 Hours  5  Dyspnea Min Last 24 hours  3  Anxiety Max Last 24 Hours  5  Anxiety Min Last 24 Hours  3  Psychosocial & Spiritual Assessment  Palliative Care Outcomes  Patient/Family meeting held?  Yes  Who was at the meeting?  Son Glennon Mac and patient  Palliative Care Outcomes  Clarified goals of care, Improved pain interventions, Improved non-pain symptom therapy, Counseled regarding hospice, Transitioned to hospice      Time In: 1:00 Time Out: 1:45 Time Total: 45 min. Greater than 50%  of this time was spent counseling and coordinating care  related to the above assessment and plan.  Signed by: Imogene Burn, PA-C Palliative Medicine  Pager: [phone]  Please contact Palliative Medicine Team phone at [phone] for questions and concerns.  For individual provider: See Shea Evans

## 2017-01-01 NOTE — Care Management (Signed)
patient lives at home with her son.  She is on chronic home 02 through [facility].  She is extremely cachetic with severe pulmonary fibrosis.  Palliative care consulted and patient wishes to have hospice services at home.  Had [facility] in the past and wishes to have that agency again.  Declines hospital bed saying her apartment is too small. Referral  called to En[facility] .

## 2017-01-01 NOTE — Care Management Obs Status (Signed)
[place] NOTIFICATION   Patient Details  Name: Christeen Lai MRN: [id number] Date of Birth: August 12, 1951   Medicare Observation Status Notification Given:  Yes    Katrina Stack, RN 01/01/2017, 12:48 PM

## 2017-01-01 NOTE — Progress Notes (Signed)
Patient requested an Advance Directives, which Chaplain provided. Patient said that she would need her son's help to complete it. Patient received an AD copy and she is waiting for her son. Patient will tell her nurse if she need Chaplain for notarization of her AD.

## 2017-01-01 NOTE — Discharge Instructions (Signed)
Continue home O2 [place] 5 L. Regular diet. Home with [place] service.

## 2017-01-01 NOTE — Plan of Care (Signed)
Problem: Pain Managment: Goal: General experience of comfort will improve Outcome: Progressing No complaints of pain since pt was admitted to floor from ER. Will continue to monitor.  Problem: Tissue Perfusion: Goal: Risk factors for ineffective tissue perfusion will decrease Outcome: Progressing Heparin subcutaneously q8hrs for VTE

## 2017-01-01 NOTE — Progress Notes (Signed)
*  PRELIMINARY RESULTS* Echocardiogram 2D Echocardiogram has been performed.  Tanya Horne 01/01/2017, 3:22 PM

## 2017-01-01 NOTE — Progress Notes (Signed)
New referral for [facility] at home following discharge received from [facility] Nann [facility]. Tanya Horne is a 66 year old woman with a  Known history of pulmonary fibrosis, admitted to [facility] from home on 4/1 for evaluation and treatment of shortness of breath and chest pain. She received a dose of IV steroids and nebulizer treatment. Palliative medicine was consulted and met with patient and her son Carlton. They would like to focus on her comfort with the support of hospice services. °Writer met in the room with patient and her son to initiated education regarding hospice services, philosophy and team approach to care. Patient has had [facility] services in the past and voiced her understanding. She is currently on 5 liters of oxygen and has home Oxygen with [facility]. Hospice folder/information and contact number left with patient. Patient information faxed to hospice referral. Plan is for discharge home today via [facility]. Signed DNR in place.Thank you.  °Karen Robertson RN, BSN, CHPN °[facility], Hospital Liaison °[phone] c °

## 2017-01-01 NOTE — Progress Notes (Signed)
Patient discharged home as ordered,instructions explained and well understood,prescriptions given,vital signs within normal limits,escorted by son and Engineer, manufacturing via wheelchair

## 2017-01-01 NOTE — Discharge Summary (Addendum)
[facility] [place] NAME: Tanya Horne    MR#:  [id number]  DATE OF BIRTH:  September 06, 1951  DATE OF ADMISSION:  12/31/2016   ADMITTING PHYSICIAN: Vaughan Basta, MD  DATE OF DISCHARGE: 01/01/2017 PRIMARY CARE PHYSICIAN: Patrina Levering, MD   ADMISSION DIAGNOSIS:  Pulmonary fibrosis (HCC) [J84.10] SOB (shortness of breath) [R06.02] DISCHARGE DIAGNOSIS:  Principal Problem:   Chest pain  SECONDARY DIAGNOSIS:   Past Medical History:  Diagnosis Date  . Fibroid   . Gastric ulcer   . GERD (gastroesophageal reflux disease)   . Pulmonary fibrosis [facility])    HOSPITAL COURSE:    Chest pain She is on telemetry and normal troponin. She has no Complaints of chest pain  * Chronic respiratory failure with home oxygen due to Severe pulmonary fibrosis   Not a candidate for any further treatment as per her son and this is her baseline, on nebulizer treatments. Continue home oxygen 5 L [place].  * Microcytic anemia Chronic and stable.  * Severe malnutrition The patient has been very poor prognosis. Per palliative care consult, Home with [place] [facility] hospice service. DISCHARGE CONDITIONS:  Stable, but poor prognosis, discharged to home with hospice care. CONSULTS OBTAINED:   DRUG ALLERGIES:   Allergies  Allergen Reactions  . Sulfa Antibiotics Hives   DISCHARGE MEDICATIONS:   Allergies as of 01/01/2017      Reactions   Sulfa Antibiotics Hives      Medication List    TAKE these medications   albuterol (2.5 MG/3ML) 0.083% nebulizer solution Commonly known as:  PROVENTIL Take 2.5 mg by nebulization every 6 (six) hours as needed for wheezing or shortness of breath.   diltiazem 120 MG 24 hr capsule Commonly known as:  CARDIZEM CD Take 1 capsule (120 mg total) by mouth at bedtime.   ibuprofen 200 MG tablet Commonly known as:  ADVIL,MOTRIN Take 200 mg by mouth every 6 (six) hours as needed.   lactose free nutrition Liqd Take 237  mLs by mouth 2 (two) times daily between meals.   loratadine 10 MG tablet Commonly known as:  CLARITIN Take 10 mg by mouth daily as needed for allergies.   LORazepam 2 MG/ML concentrated solution Commonly known as:  ATIVAN Take 0.3 mLs (0.6 mg total) by mouth 2 (two) times daily.   morphine 10 MG/5ML solution Take 1.3 mLs (2.6 mg total) by mouth every 2 (two) hours as needed for moderate pain (For shortness of breath or pain).   pantoprazole 40 MG tablet Commonly known as:  PROTONIX Take 1 tablet (40 mg total) by mouth 2 (two) times daily before a meal.        DISCHARGE INSTRUCTIONS:  See AVS.  If you experience worsening of your admission symptoms, develop shortness of breath, life threatening emergency, suicidal or homicidal thoughts you must seek medical attention immediately by calling 911 or calling your MD immediately  if symptoms less severe.  You Must read complete instructions/literature along with all the possible adverse reactions/side effects for all the Medicines you take and that have been prescribed to you. Take any new Medicines after you have completely understood and accpet all the possible adverse reactions/side effects.   Please note  You were cared for by a hospitalist during your hospital stay. If you have any questions about your discharge medications or the care you received while you were in the hospital after you are discharged, you can call the unit and asked to speak  with the hospitalist on call if the hospitalist that took care of you is not available. Once you are discharged, your primary care physician will handle any further medical issues. Please note that NO REFILLS for any discharge medications will be authorized once you are discharged, as it is imperative that you return to your primary care physician (or establish a relationship with a primary care physician if you do not have one) for your aftercare needs so that they can reassess your need for  medications and monitor your lab values.    On the day of Discharge:  VITAL SIGNS:  Blood pressure 104/80, pulse (!) 107, temperature 97.9 F (36.6 C), temperature source Oral, resp. rate 16, height 4\' 11"  (1.499 m), weight 75 lb (34 kg), SpO2 100 %. PHYSICAL EXAMINATION:  GENERAL:  66 y.o.-year-old patient lying in the bed with no acute distress. Severe malnutrition. EYES: Pupils equal, round, reactive to light and accommodation. No scleral icterus. Extraocular muscles intact.  HEENT: Head atraumatic, normocephalic. Oropharynx and nasopharynx clear.  NECK:  Supple, no jugular venous distention. No thyroid enlargement, no tenderness.  LUNGS: Normal breath sounds bilaterally, no wheezing, some rhonchi. No use of accessory muscles of respiration.  CARDIOVASCULAR: S1, S2 normal. No murmurs, rubs, or gallops.  ABDOMEN: Soft, non-tender, non-distended. Bowel sounds present. No organomegaly or mass.  EXTREMITIES: No pedal edema, cyanosis, or clubbing.  NEUROLOGIC: Cranial nerves II through XII are intact. Muscle strength 5/5 in all extremities. Sensation intact. Gait not checked.  PSYCHIATRIC: The patient is alert and oriented x 3.  SKIN: No obvious rash, lesion, or ulcer.  DATA REVIEW:   CBC  Recent Labs Lab 01/01/17 0634  WBC 2.6*  HGB 7.5*  HCT 25.4*  PLT 213    Chemistries   Recent Labs Lab 12/31/16 2009 01/01/17 0634  NA 140 137  K 4.1 4.5  CL 99* 98*  CO2 34* 35*  GLUCOSE 125* 102*  BUN 32* 31*  CREATININE 0.61 0.49  CALCIUM 9.0 8.7*  AST 32  --   ALT 16  --   ALKPHOS 49  --   BILITOT 0.3  --      Microbiology Results  Results for orders placed or performed during the hospital encounter of 10/26/15  Blood culture (routine x 2)     Status: None   Collection Time: 10/26/15  9:52 PM  Result Value Ref Range Status   Specimen Description BLOOD BLOOD LEFT FOREARM  Final   Special Requests BOTTLES DRAWN AEROBIC AND ANAEROBIC 5ML  Final   Culture NO GROWTH 5 DAYS   Final   Report Status 10/31/2015 FINAL  Final  Blood culture (routine x 2)     Status: None   Collection Time: 10/26/15  9:52 PM  Result Value Ref Range Status   Specimen Description BLOOD RIGHT ANTECUBITAL  Final   Special Requests BOTTLES DRAWN AEROBIC AND ANAEROBIC 5ML  Final   Culture NO GROWTH 5 DAYS  Final   Report Status 10/31/2015 FINAL  Final  Urine culture     Status: None   Collection Time: 10/26/15 10:53 PM  Result Value Ref Range Status   Specimen Description URINE, CLEAN CATCH  Final   Special Requests NONE  Final   Culture MULTIPLE SPECIES PRESENT, SUGGEST RECOLLECTION  Final   Report Status 10/28/2015 FINAL  Final  Culture, blood (x 2)     Status: None   Collection Time: 10/27/15  2:33 AM  Result Value Ref Range Status   Specimen  Description BLOOD RIGHT ANTECUBITAL  Final   Special Requests BOTTLES DRAWN AEROBIC AND ANAEROBIC 10ML  Final   Culture NO GROWTH 5 DAYS  Final   Report Status 11/01/2015 FINAL  Final  Culture, blood (x 2)     Status: None   Collection Time: 10/27/15  2:35 AM  Result Value Ref Range Status   Specimen Description BLOOD RIGHT WRIST  Final   Special Requests BOTTLES DRAWN AEROBIC AND ANAEROBIC 5ML  Final   Culture NO GROWTH 5 DAYS  Final   Report Status 11/01/2015 FINAL  Final    RADIOLOGY:  Dg Chest 1 View  Result Date: 12/31/2016 CLINICAL DATA:  Worsening dyspnea beginning 2 days ago, increased fatigue, shortness of breath, depression and weakness ; history pulmonary fibrosis, GERD, gastric ulcer EXAM: CHEST 1 VIEW COMPARISON:  Portable exam 1940 hours compared to 10/26/2015 FINDINGS: Borderline enlargement of cardiac silhouette. Mediastinal contours and pulmonary vascularity normal. Severe diffuse interstitial changes likely representing pulmonary fibrosis, slightly increased since the previous exam particularly in the LEFT upper lobe. No definite acute superimposed infiltrate, pleural effusion or pneumothorax. Postsurgical changes at LEFT  lung base. Bones demineralized. IMPRESSION: Progressive pulmonary fibrosis. No acute abnormalities. Electronically Signed   By: Lavonia Dana M.D.   On: 12/31/2016 20:29     Management plans discussed with the patient, family and they are in agreement.  CODE STATUS: DNR   TOTAL TIME TAKING CARE OF THIS PATIENT: 35 minutes.    Demetrios Loll M.D on 01/01/2017 at 1:05 PM  Between 7am to 6pm - Pager - [phone]  After 6pm go to [email or web address] - Proofreader  [facility]  Office  [phone]  CC: Primary care physician; Patrina Levering, MD   Note: This dictation was prepared with Dragon dictation along with smaller phrase technology. Any transcriptional errors that result from this process are unintentional.

## 2017-03-02 DEATH — deceased
